# Patient Record
Sex: Male | Born: 1935 | Race: White | Hispanic: No | Marital: Married | Smoking: Former smoker
Health system: Southern US, Community
[De-identification: ages and names within clinical notes are randomized; demographics above are authoritative.]

## PROBLEM LIST (undated history)

## (undated) DIAGNOSIS — N189 Chronic kidney disease, unspecified: Secondary | ICD-10-CM

## (undated) DIAGNOSIS — I6350 Cerebral infarction due to unspecified occlusion or stenosis of unspecified cerebral artery: Secondary | ICD-10-CM

## (undated) DIAGNOSIS — J069 Acute upper respiratory infection, unspecified: Secondary | ICD-10-CM

## (undated) DIAGNOSIS — E781 Pure hyperglyceridemia: Secondary | ICD-10-CM

## (undated) DIAGNOSIS — I639 Cerebral infarction, unspecified: Secondary | ICD-10-CM

## (undated) DIAGNOSIS — H269 Unspecified cataract: Secondary | ICD-10-CM

## (undated) DIAGNOSIS — R51 Headache: Secondary | ICD-10-CM

## (undated) DIAGNOSIS — I10 Essential (primary) hypertension: Secondary | ICD-10-CM

## (undated) DIAGNOSIS — E7800 Pure hypercholesterolemia, unspecified: Secondary | ICD-10-CM

## (undated) DIAGNOSIS — K219 Gastro-esophageal reflux disease without esophagitis: Secondary | ICD-10-CM

## (undated) DIAGNOSIS — G4700 Insomnia, unspecified: Secondary | ICD-10-CM

## (undated) DIAGNOSIS — J309 Allergic rhinitis, unspecified: Secondary | ICD-10-CM

## (undated) DIAGNOSIS — N401 Enlarged prostate with lower urinary tract symptoms: Secondary | ICD-10-CM

## (undated) DIAGNOSIS — G459 Transient cerebral ischemic attack, unspecified: Secondary | ICD-10-CM

## (undated) HISTORY — DX: Cerebral infarction due to unspecified occlusion or stenosis of unspecified cerebral artery: I63.50

## (undated) HISTORY — DX: Pure hyperglyceridemia: E78.1

## (undated) HISTORY — PX: TONSILLECTOMY: SUR1361

## (undated) HISTORY — DX: Transient cerebral ischemic attack, unspecified: G45.9

## (undated) HISTORY — DX: Insomnia, unspecified: G47.00

## (undated) HISTORY — DX: Headache: R51

## (undated) HISTORY — DX: Unspecified cataract: H26.9

## (undated) HISTORY — DX: Benign prostatic hyperplasia with lower urinary tract symptoms: N40.1

## (undated) HISTORY — DX: Gastro-esophageal reflux disease without esophagitis: K21.9

## (undated) HISTORY — DX: Chronic kidney disease, unspecified: N18.9

## (undated) HISTORY — DX: Allergic rhinitis, unspecified: J30.9

## (undated) HISTORY — DX: Pure hypercholesterolemia, unspecified: E78.00

## (undated) HISTORY — DX: Essential (primary) hypertension: I10

## (undated) HISTORY — PX: KNEE SURGERY: SHX244

## (undated) HISTORY — PX: COLONOSCOPY: SHX174

---

## 1997-12-01 ENCOUNTER — Ambulatory Visit (HOSPITAL_BASED_OUTPATIENT_CLINIC_OR_DEPARTMENT_OTHER): Admission: RE | Admit: 1997-12-01 | Discharge: 1997-12-01 | Payer: Self-pay | Admitting: Orthopedic Surgery

## 1997-12-22 ENCOUNTER — Other Ambulatory Visit: Admission: RE | Admit: 1997-12-22 | Discharge: 1997-12-22 | Payer: Self-pay | Admitting: Orthopedic Surgery

## 1997-12-27 ENCOUNTER — Ambulatory Visit (HOSPITAL_COMMUNITY): Admission: RE | Admit: 1997-12-27 | Discharge: 1997-12-27 | Payer: Self-pay | Admitting: Orthopedic Surgery

## 1997-12-28 ENCOUNTER — Inpatient Hospital Stay (HOSPITAL_COMMUNITY): Admission: RE | Admit: 1997-12-28 | Discharge: 1998-01-02 | Payer: Self-pay | Admitting: Orthopedic Surgery

## 1998-01-04 ENCOUNTER — Encounter (HOSPITAL_COMMUNITY): Admission: RE | Admit: 1998-01-04 | Discharge: 1998-04-04 | Payer: Self-pay | Admitting: Orthopedic Surgery

## 1998-02-28 ENCOUNTER — Ambulatory Visit (HOSPITAL_BASED_OUTPATIENT_CLINIC_OR_DEPARTMENT_OTHER): Admission: RE | Admit: 1998-02-28 | Discharge: 1998-02-28 | Payer: Self-pay | Admitting: Orthopedic Surgery

## 1999-05-30 ENCOUNTER — Encounter: Payer: Self-pay | Admitting: Orthopedic Surgery

## 1999-05-30 ENCOUNTER — Inpatient Hospital Stay (HOSPITAL_COMMUNITY): Admission: RE | Admit: 1999-05-30 | Discharge: 1999-06-04 | Payer: Self-pay | Admitting: Orthopedic Surgery

## 2001-03-08 ENCOUNTER — Encounter: Admission: RE | Admit: 2001-03-08 | Discharge: 2001-03-08 | Payer: Self-pay | Admitting: Internal Medicine

## 2001-03-22 ENCOUNTER — Encounter: Admission: RE | Admit: 2001-03-22 | Discharge: 2001-03-22 | Payer: Self-pay | Admitting: Internal Medicine

## 2001-08-26 ENCOUNTER — Encounter: Admission: RE | Admit: 2001-08-26 | Discharge: 2001-08-26 | Payer: Self-pay | Admitting: Internal Medicine

## 2001-08-26 ENCOUNTER — Encounter: Payer: Self-pay | Admitting: Internal Medicine

## 2003-06-21 ENCOUNTER — Encounter: Admission: RE | Admit: 2003-06-21 | Discharge: 2003-06-21 | Payer: Self-pay | Admitting: Infectious Diseases

## 2004-05-08 ENCOUNTER — Ambulatory Visit: Payer: Self-pay | Admitting: Internal Medicine

## 2004-09-04 ENCOUNTER — Ambulatory Visit: Payer: Self-pay | Admitting: Internal Medicine

## 2004-09-16 ENCOUNTER — Ambulatory Visit: Payer: Self-pay | Admitting: Internal Medicine

## 2004-11-11 ENCOUNTER — Ambulatory Visit: Payer: Self-pay | Admitting: Internal Medicine

## 2004-11-19 ENCOUNTER — Ambulatory Visit: Payer: Self-pay | Admitting: Internal Medicine

## 2005-07-02 ENCOUNTER — Ambulatory Visit: Payer: Self-pay | Admitting: Internal Medicine

## 2006-06-23 HISTORY — PX: JOINT REPLACEMENT: SHX530

## 2006-07-02 ENCOUNTER — Ambulatory Visit: Payer: Self-pay | Admitting: Internal Medicine

## 2006-07-06 ENCOUNTER — Ambulatory Visit: Payer: Self-pay | Admitting: Internal Medicine

## 2006-07-06 LAB — CONVERTED CEMR LAB
ALT: 22 units/L (ref 0–40)
CO2: 26 meq/L (ref 19–32)
Chloride: 106 meq/L (ref 96–112)
Chol/HDL Ratio, serum: 4.8
Creatinine, Ser: 1.8 mg/dL — ABNORMAL HIGH (ref 0.4–1.5)
Eosinophil percent: 0.9 % (ref 0.0–5.0)
GFR calc Af Amer: 48 mL/min
GFR calc non Af Amer: 40 mL/min
Glomerular Filtration Rate, Af Am: 48 mL/min/{1.73_m2}
Glucose, Bld: 142 mg/dL — ABNORMAL HIGH (ref 70–99)
LDL DIRECT: 99.3 mg/dL
Lymphocytes Relative: 26.8 % (ref 12.0–46.0)
MCHC: 32.6 g/dL (ref 30.0–36.0)
MCV: 91 fL (ref 78.0–100.0)
Monocytes Absolute: 0.5 10*3/uL (ref 0.2–0.7)
Monocytes Relative: 7.6 % (ref 3.0–11.0)
Neutro Abs: 3.9 10*3/uL (ref 1.4–7.7)
Platelets: 248 10*3/uL (ref 150–400)
RBC: 4.78 M/uL (ref 4.22–5.81)
Sodium: 140 meq/L (ref 135–145)
TSH: 3.52 microintl units/mL (ref 0.35–5.50)
Triglyceride fasting, serum: 201 mg/dL (ref 0–149)
VLDL: 40 mg/dL (ref 0–40)

## 2006-07-14 ENCOUNTER — Ambulatory Visit: Payer: Self-pay | Admitting: Internal Medicine

## 2006-07-23 DIAGNOSIS — R809 Proteinuria, unspecified: Secondary | ICD-10-CM | POA: Insufficient documentation

## 2006-07-23 DIAGNOSIS — E7800 Pure hypercholesterolemia, unspecified: Secondary | ICD-10-CM | POA: Insufficient documentation

## 2006-07-23 DIAGNOSIS — M8618 Other acute osteomyelitis, other site: Secondary | ICD-10-CM

## 2006-08-18 ENCOUNTER — Ambulatory Visit: Payer: Self-pay | Admitting: Infectious Diseases

## 2006-08-18 LAB — CONVERTED CEMR LAB
HCT: 46.9 % (ref 39.0–52.0)
Hemoglobin: 14.7 g/dL (ref 13.0–17.0)
MCV: 93.2 fL (ref 78.0–100.0)
Platelets: 277 10*3/uL (ref 150–400)
RDW: 14.4 % — ABNORMAL HIGH (ref 11.5–14.0)
WBC: 6.2 10*3/uL (ref 4.0–10.5)

## 2006-09-21 ENCOUNTER — Inpatient Hospital Stay (HOSPITAL_COMMUNITY): Admission: RE | Admit: 2006-09-21 | Discharge: 2006-09-28 | Payer: Self-pay | Admitting: Orthopedic Surgery

## 2006-09-25 ENCOUNTER — Ambulatory Visit: Payer: Self-pay | Admitting: Infectious Diseases

## 2006-11-12 ENCOUNTER — Ambulatory Visit: Payer: Self-pay | Admitting: Internal Medicine

## 2006-11-18 ENCOUNTER — Ambulatory Visit: Payer: Self-pay | Admitting: Internal Medicine

## 2006-11-18 LAB — CONVERTED CEMR LAB
Basophils Relative: 0.4 % (ref 0.0–1.0)
CRP, High Sensitivity: 2 (ref 0.00–5.00)
Eosinophils Absolute: 0.1 10*3/uL (ref 0.0–0.6)
Eosinophils Relative: 1.9 % (ref 0.0–5.0)
Hemoglobin: 12.7 g/dL — ABNORMAL LOW (ref 13.0–17.0)
Lymphocytes Relative: 36.1 % (ref 12.0–46.0)
MCV: 85.8 fL (ref 78.0–100.0)
Monocytes Absolute: 0.4 10*3/uL (ref 0.2–0.7)
Neutro Abs: 2.2 10*3/uL (ref 1.4–7.7)
Platelets: 212 10*3/uL (ref 150–400)
WBC: 4.2 10*3/uL — ABNORMAL LOW (ref 4.5–10.5)

## 2006-11-29 LAB — CONVERTED CEMR LAB: Hgb A1c MFr Bld: 5.6 % (ref 4.6–6.0)

## 2006-11-30 ENCOUNTER — Inpatient Hospital Stay (HOSPITAL_COMMUNITY): Admission: RE | Admit: 2006-11-30 | Discharge: 2006-12-04 | Payer: Self-pay | Admitting: Orthopedic Surgery

## 2007-04-19 ENCOUNTER — Telehealth: Payer: Self-pay | Admitting: Internal Medicine

## 2008-04-04 ENCOUNTER — Encounter: Admission: RE | Admit: 2008-04-04 | Discharge: 2008-04-04 | Payer: Self-pay | Admitting: Orthopedic Surgery

## 2009-10-15 DIAGNOSIS — K439 Ventral hernia without obstruction or gangrene: Secondary | ICD-10-CM

## 2009-10-15 HISTORY — DX: Ventral hernia without obstruction or gangrene: K43.9

## 2010-02-21 DIAGNOSIS — G459 Transient cerebral ischemic attack, unspecified: Secondary | ICD-10-CM

## 2010-02-21 HISTORY — DX: Transient cerebral ischemic attack, unspecified: G45.9

## 2010-03-07 ENCOUNTER — Inpatient Hospital Stay (HOSPITAL_COMMUNITY): Admission: EM | Admit: 2010-03-07 | Discharge: 2010-03-08 | Payer: Self-pay | Admitting: Emergency Medicine

## 2010-03-07 ENCOUNTER — Ambulatory Visit: Payer: Self-pay | Admitting: Internal Medicine

## 2010-03-08 ENCOUNTER — Encounter (INDEPENDENT_AMBULATORY_CARE_PROVIDER_SITE_OTHER): Payer: Self-pay | Admitting: Internal Medicine

## 2010-03-13 ENCOUNTER — Encounter
Admission: RE | Admit: 2010-03-13 | Discharge: 2010-06-11 | Payer: Self-pay | Source: Home / Self Care | Attending: Family Medicine | Admitting: Family Medicine

## 2010-08-07 ENCOUNTER — Telehealth: Payer: Self-pay | Admitting: Internal Medicine

## 2010-08-14 NOTE — Progress Notes (Addendum)
Summary: needs to talk to nurse  Phone Note Call from Patient Call back at Home Phone [phone]   Caller: Myrtie Hawk = JEWEL = [phone] Summary of Call: Jewel called to talk to Dr Alwyn Ren about getting patient back in to see Dr Fredrik Cove seen 07/14/2006 for CPX---wife needs to talk about patients "situation"---she wouldnt explain what she was talking about----says that Dr Jimmey Ralph has been talking about patient's "situation" , but patient would like to get back in with Dr Alwyn Ren Initial call taken by: Jerolyn Shin,  August 07, 2010 5:06 PM  Follow-up for Phone Call        I called for more info and per the patient he transferred his care in 2008. (I do not see notes of this in the computer).  She put the patient on the phone and he states that his "situation" is "headaches and stuff" but it is being taken care of by another MD. Per the patient he does not need an appt with [facility]. Follow-up by: Lucious Groves CMA,  August 08, 2010 8:37 AM  Additional Follow-up for Phone Call Additional follow up Details #1::        Lauris Chroman; this patient transferred to another MD for same reason  as Bunnie Pion. The Schedulers should refer to Chrae or me if timely appt is an issue to the patient . I catch grief when I see these individuals outside the office.Thanks, Hopp Additional Follow-up by: Marga Melnick MD,  August 08, 2010 12:11 PM

## 2010-09-05 LAB — CBC
HCT: 45.3 % (ref 39.0–52.0)
Hemoglobin: 15.5 g/dL (ref 13.0–17.0)
MCH: 31.9 pg (ref 26.0–34.0)
MCHC: 34.2 g/dL (ref 30.0–36.0)
MCV: 93.2 fL (ref 78.0–100.0)
RDW: 13.7 % (ref 11.5–15.5)

## 2010-09-05 LAB — GLUCOSE, CAPILLARY
Glucose-Capillary: 128 mg/dL — ABNORMAL HIGH (ref 70–99)
Glucose-Capillary: 172 mg/dL — ABNORMAL HIGH (ref 70–99)

## 2010-09-05 LAB — COMPREHENSIVE METABOLIC PANEL
ALT: 28 U/L (ref 0–53)
ALT: 28 U/L (ref 0–53)
AST: 23 U/L (ref 0–37)
Alkaline Phosphatase: 47 U/L (ref 39–117)
Alkaline Phosphatase: 48 U/L (ref 39–117)
BUN: 21 mg/dL (ref 6–23)
CO2: 25 mEq/L (ref 19–32)
Calcium: 9 mg/dL (ref 8.4–10.5)
Chloride: 109 mEq/L (ref 96–112)
GFR calc Af Amer: 60 mL/min — ABNORMAL LOW (ref >60)
GFR calc non Af Amer: 46 mL/min — ABNORMAL LOW (ref >60)
Glucose, Bld: 120 mg/dL — ABNORMAL HIGH (ref 70–99)
Potassium: 4.1 mEq/L (ref 3.5–5.1)
Potassium: 4.3 mEq/L (ref 3.5–5.1)
Sodium: 139 mEq/L (ref 135–145)
Sodium: 139 mEq/L (ref 135–145)
Total Bilirubin: 0.6 mg/dL (ref 0.3–1.2)
Total Protein: 6.3 g/dL (ref 6.0–8.3)
Total Protein: 6.8 g/dL (ref 6.0–8.3)

## 2010-09-05 LAB — DIFFERENTIAL
Basophils Absolute: 0 10*3/uL (ref 0.0–0.1)
Basophils Relative: 0 % (ref 0–1)
Eosinophils Absolute: 0.1 10*3/uL (ref 0.0–0.7)
Monocytes Relative: 7 % (ref 3–12)
Neutro Abs: 2.8 10*3/uL (ref 1.7–7.7)
Neutrophils Relative %: 54 % (ref 43–77)

## 2010-09-05 LAB — URINALYSIS, ROUTINE W REFLEX MICROSCOPIC
Bilirubin Urine: NEGATIVE
Glucose, UA: NEGATIVE mg/dL
Hgb urine dipstick: NEGATIVE
Protein, ur: NEGATIVE mg/dL

## 2010-09-05 LAB — LIPID PANEL
Cholesterol: 166 mg/dL (ref 0–200)
LDL Cholesterol: 51 mg/dL (ref 0–99)
Triglycerides: 383 mg/dL — ABNORMAL HIGH (ref <150)

## 2010-09-05 LAB — CK TOTAL AND CKMB (NOT AT ARMC)
CK, MB: 2.4 ng/mL (ref 0.3–4.0)
CK, MB: 2.6 ng/mL (ref 0.3–4.0)
Total CK: 167 U/L (ref 7–232)

## 2010-09-05 LAB — PROTIME-INR: INR: 0.93 (ref 0.00–1.49)

## 2010-09-05 LAB — APTT: aPTT: 28 seconds (ref 24–37)

## 2010-11-05 NOTE — Op Note (Signed)
NAME:  Jason, [name] NO.:  [id number]   MEDICAL RECORD NO.:  [id number]          PATIENT TYPE:  INP   LOCATION:  5025                         FACILITY:  [facility]   PHYSICIAN:  Feliberto Gottron. Turner Daniels, M.D.   DATE OF BIRTH:  [birth year]   DATE OF PROCEDURE:  11/30/2006  DATE OF DISCHARGE:                               OPERATIVE REPORT   PREOPERATIVE DIAGNOSIS:  Status post removal of infected right total  knee prosthesis a little over 8 weeks ago with methicillin-sensitive  Staphylococcus aureus, placement of PMMA spacer.   POSTOPERATIVE DIAGNOSIS:  Status post removal of infected right total  knee prosthesis a little over 8 weeks ago with methicillin-sensitive  Staphylococcus aureus, placement of PMMA spacer.   PROCEDURES:  1. Removal of PMMA spacer.  2. Revision right total knee using DePuy SROM components.  On the      femoral side a large posterior-stabilized femur with a 40 mm module      and a 17 x 100 stem.  On the tibial side a medium tibial baseplate      with a 45 module and a 13 x 100 stem.  A 12 mm posterior-stabilized      rotating bearing and a 41 mm patellar button.  All components      cemented with Palacos cement, a triple batch with 2.2 g of Zinacef.   SURGEON:  Feliberto Gottron. Turner Daniels, M.D.   FIRST ASSISTANT:  Erskine Squibb B. Jannet Mantis.   ANESTHETIC:  General endotracheal with a femoral nerve block.   ESTIMATED BLOOD LOSS:  200 mL because part of the procedure was done  with the tourniquet down .   FLUID REPLACEMENT:  1500 mL crystalloid.   TOURNIQUET TIME:  2 hours 5 minutes.   INDICATIONS FOR PROCEDURE:  The patient is a 75 year old gentleman who  had a primary total knee done in 1999.  Had an infection with Staph  aureus about a year later that responded well to an open washout and  spacer swap.  He was suppressed on antibiotics for almost 9 years,  played tennis, golf was very active, and just this year had the  development of an abscess on the lateral  side of the right knee.  The  abscess went down deep to the knee when we took him to surgery for I&D,  and the components were therefore removed and a PMMA spacer placed.  It  was still methicillin-sensitive staph.  He was treated with 6 weeks of  IV antibiotics, which were discontinued.  Two weeks later we attempted  aspirate the spacer block area.  We got 1 mL of fluid back that had no  white cells and no organisms was culture-negative.  His sedimentation  rate and CRP went to normal.  He elected to have a removal of the spacer  and reimplantation total knee.  He did have some significant bony lysis  around the tibia and the femur, and stemmed implants from the SROM  system were selected.  Risks and benefits of surgery discussed.  He  understands that the chance of am infection recurring  is around 15%.   DESCRIPTION OF PROCEDURE:  After placement of a femoral nerve block in  the block area, the patient was taken to the operating room at [facility] and the appropriate anesthetic monitors were attached, general  endotracheal anesthesia induced with the patient in supine position.  Tourniquet applied high to the right thigh, lateral post and foot  positioner applied, and the right lower extremity prepped and draped in  the usual sterile fashion from the toes to the tourniquet.  We began the  procedure with the tourniquet down.  Utilizing the old anterior midline  incision, we cut through the skin and subcutaneous tissue down to the  prepatellar fascia and quad tendon and patellar tendon and then made a  medial parapatellar arthrotomy, allowing [place] to enter the pseudo-joint of  the knee where the femorotibial spacer and the patellofemoral spacer  were located.  Exuberant scar tissue was removed, gram stain and  cultures were sent that came back negative, and after freeing up the  patella we removed the patellar spacer without any difficulty.  The  tibiofemoral spacer was removed  piecemeal with osteotomes.  We did  elevate the superficial medial collateral ligament from anterior to  posterior around the tibia, leaving it intact distally to enhance our  exposure.  Further scar tissue was removed.  The knee was then flexed  with the patella not everted at the beginning as we removed scar tissue  and then as things loosened up, we were able to evert the patella.  We  then instrumented the proximal tibia with the reamers and reamed up to a  13 mm reamer to the depth for a 100 stem.  The a conical ream followed  by the broaching up to a 45 tibial broach, which allowed a 1-2 mm  resection off the broach of the proximal tibia.  We then entered the  distal femur with the reamers, reamed up to a 17 reamer to the depth a  for 100 stem, conically reamed the femur and broached up to a 40 femoral  broach.  A thin resection was done off the distal femur with that broach  and because of the intact ligaments, we elected to do a posterior-  stabilized Noyes knee at this point.  We selected the large femoral  component for sizing and then utilizing the AP cutting block pinned the  cutting block in place and there was no AP cut to actually perform.  This was followed by the chamfer cutting block, which did remove a  little bit of bone but not much.  We then removed the broach and the  trial stem and assembled a trial femoral component with a large right  trial femur, a 40 conical module, and a 17 x 100 stem.  On the tibial  side we used a 3 tibial baseplate and used a 12 mm posterior-stabilized  trial spacer.  The knee came to full extension, flexed to about 110  degrees, limited by scar tissue.  The patella, which had been sized to a  41 button, tracked in a good fashion.  At this point the knee was placed  in extension.  The patella was everted.  One to 2 mm of bone was  resected from the patella and scar tissue was removed.  We sized for a 41 button and drilled the lollipop.  At  this point all trial components  were removed, being careful to note the rotation of the cones on  the  femur and the tibia.  All bony surfaces were then water-picked clean and  dried with suction and sponges.  At this point a triple batch of Palacos  cement with 2.25 g of Zinacef was mixed, applied to all bony and  metallic surface after first assembling the real components on the  tibial and femoral side, being careful to match the rotation of the  modules, and then in order on the tibial side we hammered into place a  medium tibial baseplate with a 45 cone and a 13 x 100 stem, on the  femoral side a large femoral component with a 40 module and a 17 x 100  stem.  The patellar button selected was a 41 patellar button, and this  was squeezed into place.  Excess cement was removed.  The knee was taken  to full extension to further compress the cement and then taken through  a range of motion, confirming good patellar tracking.  Medium Hemovac  drains were placed deep in the wound and once again the surfaces were  water-picked clean.  The parapatellar arthrotomy was closed with running  #1 Vicryl suture, the subcutaneous tissue, because he is relatively  slender, with 2-0 Vicryl suture, and the skin with skin staples.  A  dressing of Xeroform, 4x4 dressing sponges, Webril and an Ace wrap  applied.  At this point the tourniquet was let down.  The patient was  awakened and taken to the recovery room without difficulty.      Feliberto Gottron. Turner Daniels, M.D.  Electronically Signed     FJR/MEDQ  D:  11/30/2006  T:  12/01/2006  Job:  [id number]

## 2010-11-05 NOTE — Discharge Summary (Signed)
NAME:  Jason [name], Jason [name]              ACCOUNT NO.:  [id number]   MEDICAL RECORD NO.:  [id number]          PATIENT TYPE:  INP   LOCATION:  5015                         FACILITY:  [facility]   PHYSICIAN:  Erskine Squibb B. Su Hilt, P.A.  DATE OF BIRTH:  [birth year]   DATE OF ADMISSION:  09/21/2006  DATE OF DISCHARGE:  09/28/2006                               DISCHARGE SUMMARY   PRIMARY DIAGNOSIS FOR THIS ADMISSION:  Chronically infected right total  knee arthroplasty.   PROCEDURE WHILE IN HOSPITAL:  Removal of right total knee arthroplasty  with irrigation and debridement.   DISCHARGE SUMMARY:  The patient is a 75 year old male who underwent  total knee arthroplasty in 1999.  He developed an infection about 14  months after the initial implant that was Methylin sensitive staph.  The  patient was able to be treated with irrigation debridement, poly change-  out, and suppressive therapy of Keflex and rifampin for several years.  For the last 2 years he has been on Keflex only.  He did well until a  few months ago when after changing his work out routine, he started  developing more swelling which led to a lateral abscess in the knee.  A  CT scan was accomplished showing multiple bony cysts consistent with  bony abscesses.  No listing of the implants, but because of the soft  tissue abscess which came back culture negative times 3, probably  because of the antibiotic suppression, it was felt that he should be  taken for a radical irrigation debridement with removal of the part.  Risks versus benefits were discussed and he has agreed to the surgery.   ALLERGIES:  NO KNOWN DRUG ALLERGIES.   MEDICATIONS:  Rifampin, Keflex, and Vytorin.   PAST MEDICAL HISTORY:  Usual childhood diseases.  Adult history:  DJD  and increased cholesterol.   PAST SURGICAL HISTORY:  Right total knee arthroplasty right knee.  He  has had some difficulty with anesthesia.   SOCIAL HISTORY:  No tobacco.  Occasional ethanol.  No  IV drug abuse.  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   FAMILY HISTORY:  Mother died at age 32.  Father died at age 50 with a  history of MI.   REVIEW OF SYSTEMS:  Positive for glasses.  Denies any chest pain,  shortness of breath, or illness recently other than the infection in the  knee.   PHYSICAL EXAMINATION:  VITAL SIGNS:  The patient's temperature is 98.6,  pulse 80, respirations 18, blood pressure 150/84.  GENERAL:  He is a 6 foot, 200 pound male.  HEAD:  Normocephalic, atraumatic.  EARS:  TMs are clear.  EYES:  Pupils equal, round, and reactive to light and accommodation.  THROAT:  Patent.  NECK:  Supple.  Full range of motion.  CHEST:  Clear to auscultation and percussion.  HEART:  Regular rate and rhythm.  ABDOMEN:  Soft, nontender.  EXTREMITIES:  The right knee has positive edema with a draining wound  laterally.  Minimal pain.  Stable ligaments.  SKIN:  Within normal limits with exception of the open  abscess on the  right knee.   CT scan with positive bony erosion secondary to infection.   PREOPERATIVE LABS:  Including CBC, c-Met, chest x-ray, EKG, and PTT were  all within normal limits with exception of the EKG showing sinus  bradycardia.   HOSPITAL COURSE:  On the day of admission, the patient was taken to the  operating room at [facility] where he underwent a radical irrigation  and debridement of the right total knee with removal of all metallic  parts, cement, and plastic followed by placement of a  polymethylmethacrylate spacer, which was a quadruple batch of poly  __________ bone cement with a 4.8 grams of tobramycin ad 3 grams of  Zinacef imbedded into the cement.  Tissue specimens were sent for  culture.  The patient was placed on the pre and perioperative  antibiotics.  He was placed on postoperative Coumadin prophylaxis with a  target INR of 1.5 to 2, with Lovenox bridging until he became  therapeutic.  He was put on a PCA Dilaudid pain pump for  pain control.  Large bore HemoVac's were placed inside the wound.  There was really no  significant physical therapy goal in the short-term.  Postoperative day  1, the patient was awake and alert with moderate pain.  Vital signs were  stable.  Dressing was soaked through, one was clean and dry at the time  of exam.  Hemoglobin 11.6, WBC 5.7.  PT 15.  Culture was negative times  24-hours.  IV antibiotics were continued with a plan for vancomycin for  6 to 12 weeks postoperatively.  Dr. Orvan Falconer from infectious disease was  consulted for further evaluation.  Postoperative day 2, the patient was  complaining of moderate pain.  T-max 99.3.  WBC of 5.5.  INR 1.3.  High  dressing with scant blood.  Drain output was 65 mL, otherwise stable.  Foley was discontinued without difficulty.  Postoperative day 3,  basically unchanged other than WBC dropping to 4.9, INR to 1.5, drain  output 25+ Q-shift.  PCA was discontinued.  PIC line placement was  ordered, the plan being to continue IV antibiotics for 6 weeks  postoperatively, as well as rifampin.  Postoperative day 4, the patient  was without complaint.  He was out of bed to the hallway with a knee  mobilizer, afebrile, vital signs were stable.  Drain output 75 mL.  The  wound was benign.  Minimal oozing at the incision.  Otherwise stable.  Postoperative day 5, basically unchanged.  Continue IV antibiotics.  Drain remained active.  Postoperative day 6, again no change.  Postoperative day 7, the patient was without complaint.  He was out of  bed to the hall.  Independent transfers.  He was afebrile.  Vital signs  were stable.  PT of 19.5, INR 1.6, drain output was zero for 24-hours  and was discontinued.  Wound was benign.  He was discharged home in the  care of his family.  He will be followed by [facility] regarding  specifics of home IV therapy.  IV Ancef 1 gram every eight hours times six weeks.  Continue with Coumadin per pharmacy protocol.   As well as  Tylox for pain.  Resume his Vytorin.  Two days partial weight-bearing of  the right lower extremity with a walker.  He may shower on postoperative  day #10.  Dressing changes every day.  Return to see [place] sooner should he  have any difficulties.  Laural Benes. Jannet Mantis.     JBR/MEDQ  D:  11/09/2006  T:  11/09/2006  Job:  [id number]

## 2010-11-08 NOTE — Discharge Summary (Signed)
NAME:  Jason [name], Jason [name] NO.:  [id number]   MEDICAL RECORD NO.:  [id number]          PATIENT TYPE:  INP   LOCATION:  5025                         FACILITY:  [facility]   PHYSICIAN:  Feliberto Gottron. Turner Daniels, M.D.   DATE OF BIRTH:  [birth year]   DATE OF ADMISSION:  11/30/2006  DATE OF DISCHARGE:  12/04/2006                               DISCHARGE SUMMARY   ADMISSION DIAGNOSIS:  History of right knee infection with removal of  total knee arthroplasty parts.   PROCEDURE:  Reimplantation of total knee arthroplasty of the right knee  status post infection.   DISCHARGE SUMMARY:  The patient is a 75 year old gentleman who had prior  total knee done in 1999, had an infection with Staphylococcus aureus  about a year later that responded well to open washout and spacer swap.  He was suppressed on antibiotics for almost 9 years, played tennis,  golf, otherwise very active.  Just this last year has had development of  an abscess on the lateral side of the right knee.  Abscess went down to  the knee and he was taken to surgery for an I&D, components removed and  a PMMA spacer placed.  It was still methicillin-sensitive  Staphylococcus.  He was treated with 6 weeks of IV antibiotics, which  were discontinued.  Two weeks later re-aspirate was attempted, 1 mL  fluid had no white cells, no organisms, otherwise culture negative.  Sed  rate and CRP were normal.  He was elected to have removal of the spacer  reimplantation of total knee.  He has had significant bony lysis around  the tibia and the femur and stem implants from the SROM system will be  used.  Risk and benefits of the surgery were discussed.  He understands  the chance of a reinfection is somewhere around 15%.   ALLERGIES:  THE PATIENT HAS NO KNOWN DRUG ALLERGIES.   MEDICATIONS:  Occasional Percocet.   PAST MEDICAL HISTORY:  1. Usual childhood disease.  2. Adult history degenerative joint disease.  3. Increased cholesterol.   PAST SURGICAL HISTORY:  Right total knee arthroplasty,  removal of right  total knee arthroplasty, no difficulty or DVT.   SOCIAL HISTORY:  No tobacco.  Positive ethanol occasionally.  No IV drug  abuse.  He is married.  He is a Psychologist, sport and exercise here in town.   FAMILY HISTORY:  Mother died at age 41.  Father died at age 16 with  history of heart attack.   REVIEW OF SYSTEMS:  Positive for glasses only.   PHYSICAL EXAMINATION:  VITAL SIGNS:  The patient is afebrile,  respirations 28, blood pressure 148/90, he is 6 feet and 200 pounds.  HEAD:  Normocephalic, atraumatic.  EARS:  Tympanic membranes clear.  EYES:  Pupils equal, round, reactive to light and accommodation.  NOSE:  Pink.  THROAT:  Benign.  NECK:  Full range of motion.  CHEST:  Clear to auscultation and percussion.  HEART:  Regular rate and rhythm.  ABDOMEN:  Soft and nontender.  Bowel sounds 2+.  GENITOURINARY:  Not performed.  EXTREMITIES:  Right knee  minimal tenderness, minimal erythema, no  obvious infection.  SKIN:  Neurovascularly intact with a well-healed normal scar.   LABORATORY DATA:  Preoperative labs including CBC, c-Met, chest x-ray,  EKG, PT, and PTT were all within normal limits with the exception of BUN  of 25 and an eGFR of 46.   HOSPITAL COURSE:  On day of admission, the patient was taken to the  operating room at [facility] where he underwent removal of PMMA  spacer followed by the revision of right total knee arthroplasty and  Depuy SROM components.  Femoral side used a large posterior stabilized  femur with a 40 mm module and a 17 x 100 stem on the tibial side, medium  tibial baseplate was 45 module and a 13 x 100 stem, a 12 mm posterior  stabilize rotating bearing, and a 41 mm patellar button.  All components  cemented with Palacos cement, triple batch with 2.2 grams of Zinacef  embedded.  Medium Hemovac drain was placed postoperatively, double-armed  into the wound.  The patient was placed on  preoperative antibiotics,  placed on postoperative Coumadin prophylaxis.  We will target an INR of  1.5 to 2 with protein and Lovenox therapies.  Placed on a PCA Dilaudid  pump for pain control.  On postoperative day 1, the patient was awake  and alert, in moderate pain, taking p.o. as well.  No nausea or  vomiting.  Hemoglobin 10.0, WBC 4.4, PT of 14.5.  Urine output stable.  X-rays showed well-placed prosthesis, otherwise stable.  Hemovac was  discontinued.  The patient began working on range of motion.  On  postoperative day 2, the patient continued to respond well to physical  therapy.  Vital signs were stable.  INR of 1.7, hemoglobin of 9.1,  cultures remained negative from the intraoperative cultures that were  done.  Postoperative day 3, the patient was without complaint.  Progressing well with physical therapy.  Temperature max of 100.4  ranging 99.2.  WBC of 3.9, hemoglobin at 8.2, INR 1.7.  The wound was  benign.  Physical therapy was continued.  Postoperative day 4, the  patient was without complaint.  Out of bed to the hallway with  independent transfers.  He was afebrile.  INR 1.6.  The dressing was dry  and was benign.  He was otherwise medically stable and orthopedically  improved.  He was discharged home in the care of his family.  Return to  the clinic in approximately one week's time.  Dressing changes daily or  as needed.  He was okayed for seated showers.   DISCHARGE MEDICATIONS:  1. Tylox.  2. Coumadin with followup care by [facility] per their      pharmacy protocol.  3. Robaxin.  Home medication requisition sheet was signed.   ACTIVITY:  Weightbearing as tolerated with total knee precautions using  a walker.  Home health PT, home health RN.   DIET:  Regular.      Laural Benes. Jannet Mantis.      Feliberto Gottron. Turner Daniels, M.D.  Electronically Signed    JBR/MEDQ  D:  03/29/2007  T:  03/29/2007  Job:  [id number]

## 2010-11-08 NOTE — Discharge Summary (Signed)
[place]  Patient:    Jason [name], Jason [name]                       MRN: [id number] Adm. Date:  05/30/99 Disc. Date: 06/04/99 Attending:  Alinda Deem, M.D. Dictator:   Dorthula Matas, P.A.-C.                           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  PROCEDURE WHILE IN HOSPITAL:  Revision of right total knee with radical irrigation debridement and total synovectomy and debridement of osteomyelitis.  INTRODUCTION:  The patient is a 75 year old male admitted for orthopedic treatment of infected right total knee arthroplasty.  HISTORY OF PRESENT ILLNESS:  The patient had a positive history of right knee pain times many years post football injury.  He underwent a right knee open meniscectomy in his early 35s followed by steroid injection and arthroscopy.  He was seen by  Dr. Turner Daniels with initial office visit May 1999 with symptoms of increasing pain with catching.  He had two Hyalgan injection.  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  He postoperatively had a 3 to 4+ effusion with temperature of 101, negative aspirate.  On June 22, cryocuff placed.  The effusion slowly resolved and pain abated.  Reaspiration on December 22, 1997, negative for any organism, no growth in cultures.  The patient proceeded with a right total knee arthroplasty on December 28, 1997.  His postoperative course positive difficulty with scarring and lack of motion or progress despite PT and CPM.  He had closed manipulation under anesthesia February 28, 1998, with good progress in range of motion postoperatively.  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  He ad had no fevers or chills, positive aspirate of 10 cc of serosanguineous fluid in  ______  bursa along with a 10 cc similar fluid aspirate of the anterior port of  the knee joint.  No obvious pus.  The patient was afebrile.  Gram stain was negative.  Cultures on day #3 grew out positive Staphylococcus which was resistant to penicillin and sulfa.  The patient was placed on Keflex on May 22, 1999. He continued until his surgery day on December 7.  He had seen some improvement in pain and swelling and was afebrile during this course.  X-rays showed well-placed cemented components with no obvious signs of loosening.  SOCIAL HISTORY:  No tobacco, occasional ethanol.  No IV drug abuse.  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  PAST MEDICAL HISTORY:  Usual childhood diseases.  FAMILY HISTORY:  Mother deceased at age 41 due to complications of old age. Father deceased at age 13 after heart attack.  Adult disease: DJD.  PAST SURGICAL HISTORY:  Knee arthroscopy in 1963, arthroscopy in 1983, 1999. Total knee arthroplasty in 1999.  No difficulties with ______ .  ALLERGIES:  No known drug allergies.  MEDICATIONS ON ADMISSION: 1. Vioxx 25 mg 1 p.o. q.d. 2. Keflex 500 mg 1 p.o. 4 times  a day.  REVIEW OF SYSTEMS:  Positive only for glasses.  He denies all others.  PHYSICAL EXAMINATION:  VITAL SIGNS:  The patient is 72 inches tall and weighs 200 pounds.  Blood pressure 114/76.  HEENT:  Head is normocephalic, atraumatic.  Eyes: PERRLA.  TMs are clear.  Nose  clear without polyps.  Throat is clear.  NECK: Supple.  Full range of motion.  RESPIRATORY:  Clear to auscultation and percussion.  CARDIOVASCULAR:  Regular rate and rhythm with no murmurs, rubs,  or gallops.  ABDOMEN:  Soft, nontender.  No masses.  Bowel sounds 2+.  GU/RECTAL:  Deferred.  NEUROLOGIC:  Alert and oriented x 3.  Normal speech pattern.  Cranial nerves II-XII grossly intact.  ORTHOPEDIC:  Range of motion of the knee shows 1 to 2+ effusion with swollen bursa, pain and joint tenderness, neurovascularly intact to pinprick and light touch.  DTRs 2+, pulses 2+.  LABORATORY DATA:  Preoperative labs including CBC, chest x-ray, EKG, PT, and PTT were all within normal limits with the exception of sed rate of 39, potassium 5.1, C reactive protein 1.9.  HOSPITAL COURSE:  On the day of admission, the patient was taken to the operating room at [facility] where he underwent radical irrigation and debridement of the right total knee with complete total synovectomy, debridement of osteomyelitis both of the femur and the tibia, and revision of patellar and tibial components with positive application of antibiotic-laden cement.  Large bore Hemovacs were placed in the knee.  Foley catheter was placed as well.  The patient was placed on postoperative Coumadin with target INR of 1.5 to 2 per pharmacy protocol.  He was placed on postoperatively IV antibiotics, Ancef 2 g IV q.8h. s well as rifampin 300 mg b.i.d.  He was placed on a morphine PCA pump for postoperative pain control and seen in consultation with infectious disease medicine.  On the first postoperative day, the patient was awake and alert.  Decreased pain. Drain had clotted blood positive but no obvious pus.  Vital signs were stable. The patient was neurovascularly intact.  He was otherwise stable.  He was kept in an immobilizer to prevent any type of bending or other stress to the knee.  He began transferring out of bed, weightbearing as tolerated, with immobilizer.  On the second postoperative day, the patient was doing well.  Drains had positive serous drainage, no obvious pus.  He was  neurovascularly intact.  He continued V antibiotics.  On the third postop day, no significant changes.  He was doing well, taking oral foods and fluids, continued on the combination of Ancef and rifampin as well as   Coumadin.  On the fourth postoperative day, the patient was awake and alert, decreased pain. Vital signs were stable.  He was afebrile.  Drain output 25 cc and discontinued. PT 18.6.  Hemoglobin 11.6, WBC 5.  The wound was clean and dry with no effusion, and he was otherwise stable.  He continued with Ancef and rifampin and continued limited walking, weightbearing as tolerated.  On the fifth postoperative day, the patient was awake and alert, decreased pain. He was walking the room with a walker.  Vital signs were stable.  Wounds were clean and dry, decreased swelling, neurovascularly intact and was otherwise orthopedically stable.  He was discharged home to the care of family on oral Keflex as well as rifampin.  He had actually been changed over to Keflex the day before and had no  increase in temperature.  FOLLOWUP: He will return to see Dr. Turner Daniels in one week time, sooner should he have any increase in temperature, increased drainage, increase in pain.  ACTIVITY:  Weightbearing as tolerated with walker and immobilizer.  No movement of knee up to this point.  DISCHARGE MEDICATIONS:  Tylox 1 to 2 q.4h. p.r.n. pain as well as the above-mentioned antibiotics.  FINAL DIAGNOSIS:  Left total knee arthroplasty with positive Staphylococcus aureus infection.  DIET:  Regular.  WOUND CARE:  Dressing changes q.d. with 4x4s and Ace wrap. DD:  07/03/99 TD:  07/03/99 Job: [id number]

## 2010-11-08 NOTE — Op Note (Signed)
[place]  Patient:    Jason [name]                      MRN: [id number] Proc. Date: 05/30/99 Adm. Date:  [id number] Attending:  Alinda Deem                           Operative Report  PREOPERATIVE DIAGNOSIS:  Infected right total knee arthroplasty with Staphylococcus epidermitis, methicillin-sensitive.  POSTOPERATIVE DIAGNOSIS:  Infected right total knee arthroplasty with Staphylococcus epidermitis, methicillin-sensitive.  OPERATION:  Radical irrigation and debridement of right total knee with complete total synovectomy, debridement of osteomyelitis both of the femur and the tibia. Revision of patella and tibia components.  SURGEON:  Alinda Deem, M.D.  ASSISTANTS: 1. Dorthula Matas, P.A.C. 2. Dannette Barbara, P.A.S.  ANESTHESIA:  General LMA.  ESTIMATED BLOOD LOSS:  300 cc at the end of the case after the tourniquet was let down.  TOURNIQUET TIME:  1 hour and 57 minutes.  FLUIDS REPLACED:  1200 cc of crystalloid.  DRAINS:  Large bore Hemovacs.  CULTURES:  Tissue culture sent during surgery.  INDICATION:  A 75 year old man had a cemented Osteonics right total knee arthroplasty in March of 1999.  Postoperatively, he developed arthrofibrosis and did require closed manipulation and did relatively well for the next 16 to 17 months.  He was last seen in the office in March of the year 2000 doing relatively well with his right total knee.  About one week ago, he presented in the office with a heme arthrosis as well as a collection of fluid near the pes bursa insertion.  Both of these were tapped yielding bloody fluid which was sent off for Gram-stain and culture and unfortunately both of the samples came back positive for Staphylococcus epidermitis coagulase negative methicillin-sensitive.  His knee at that time was noted to be 2 to 3+ swollen, edematous, and he was placed on Keflex prior to the results of  the Gram-stain and cultures.  It should be noted that the initial Gram-stain came back occasional polyorganism and the cell count on both of the fluids were under 10,000 white cells.  This was surely a smoldering  infection and he had never any fever measured by mouth.  On further questioning, he did report that for the last couple of months he has had some increased pain and warmth in the knee but again was able to function with t. Planned radiographs did show some osteolysis around the proximal tibia but since he had been symptomatic for only a week or two as far as the swelling and pain went, we went ahead and planned to do a radical synovectomy, replacement of the polyethylene both on the patella and tibia, and of course if the tibia or femur  components were loose they would be removed and a resection arthroplasty would e accomplished with insertion of a PMA spacer.  The risks and benefits of surgery were understood by the patient and the synovectomy would be followed by six months of IV and p.o. antibiotics based on the sensitivity of the organism, probably Ancef and rifampin initially followed by Keflex and Rifampin.  DESCRIPTION OF PROCEDURE:  The patient was identified by arm band and taken to he operating room at [facility] main hospital.  Appropriate anesthetic monitors were attached and general LMA anesthesia induced with the patient in the supine position.  Tourniquet  was applied high to the right thigh and the right lower extremity was then elevated for about three minutes as it was prepped with DuraPrep.  At this point, the tourniquet was let up and we completed the prep and drape. he tourniquet was completed to 350 mmHg.  Using the old anterior midline incision we cut through the skin and subcutaneous tissue and did not encounter any purulent fluid until we cut into the joint using a medial parapatellar approach.  Unfortunately, there  was exuberant inflammation f the synovium requiring a radical synovectomy.  There were erosions around the femoral component about 1/8th of an inch in depth, although the femoral component was not loose and it was grasped with the extractor and found to be rock solid tight.  Laterally, there was some posterior erosions as well and these were all carefully curetted out back to bare, bleeding bone.  The patellar component was noted to be mildly loose and was removed without difficulty.  Loose bits of cement were also removed and the patella was recut removing about 1 mm of bone and then redrilled to accept an 11 mm modified medial patellar button.  The tibial spacer was removed without difficulty.  We noted the significant erosions to the anteromedial aspect of the proximal tibia approximately 15 mm in length, 10 mm deep, and 10 mm high.  This was also carefully curetted ut being careful to leave behind stable bone and there was noted to be remodeling f the bone at the periphery.  The tibial component was also noted to be solid.  When it was grasped with the extractor, there was no evidence of loosening.  We continued to perform a global synovectomy of all compartments of the knee and got back to healthy-looking tissue.  Once we had accomplished this, a single batch of Palicose cement was mixed with  3000 mg of Zinacef and the bony deficits underneath the tibia were packed with cement and a new patella component was cemented into place.  Excess cement was removed.  Cement was also packed around the small defects on the femur to provide both support and antibiotic delivery vehicle.  Large bore Hemovacs were then placed in the wound and the parapatellar arthrotomy was closed with running #1 Vicryl suture.  The subcutaneous tissue was closed with a single layer of 2-0 nylon vertical mattress sutures.  Continuously throughout  this procedure, we used both a combination of  normal saline pulse lavage, a one  point Clorpactin was left in the wound for about 90 seconds.  A strip of biofilm  from the components and we also used antibiotic solution.  This was all done prior to the closure.  Once the closure had been completed, a dressing of 4 by 4s, ABD, Webril, and Ace wrap was applied.  The tourniquet was left down and the patient was awakened and taken to the recovery room under suction drainage without difficulty. DD:  05/30/99 TD:  06/01/99 Job: [id number]

## 2010-11-08 NOTE — Letter (Signed)
August 05, 2006    [name]  [address]  [place], [place] [address]   RE:  RUGER, SAXER  MRN:  [id number]  /  DOB:  02-17-1936   Dear Art,   I received a fax from [facility] delineating the policy was not  issued.They stated that there had been a change in the EKG.  I compared  that EKG of July 07, 2006 to the one performed in my office, July 16, 2006.  There was a slight change in the electrical axis on their  EKG, but I feel this may have been a lead placement issue.  This was not  found on our EKG and there was no sign of ischemia or poor blood flow.  I do not feel that there is any evidence of any active coronary artery  disease.   The major issue is their concern about your kidney function and this is  a valid concern.  In reviewing your past records, your creatinine, a  measure of kidney function, was 1.8 on July 06, 2006.  Normals are  now less than 1.5.  The value was 1.6 in January 2007, but at that time,  normal creatinine was felt to be 1.6 or less.  The creatinine was also  1.6 in May 2006.  This elevation may be related to some of the  medications you have taken for the methicillin-resistant Staph aureus,  but I am more concerned about it being related to early diabetes.  Enclosed is a letter  I have written diabetes & its prevention &  control. This reiterates nutritional interventions and also explains the  A1c.  Your A1c was 6.1, which is at the diabetic level, associated with  an average sugar of 139 and a 22% increased risk of stroke.   I would recommend drinking 40 ounces of water a day and following these  nutritional guidelines.  I would recommend repeating the A1c, urine  microalbumin, BUN and creatinine in May.   The significance of the creatinine elevation is reinforced by a seconnd  kidney function test the glomerular filtration rate. Your value was  40;  this should be at least 60.This should be rechecked in  May as well.   If your creatinine continues to rise despite these nutritional  interventions a Nephrology (kidney) consultation would be appropriate.    Sincerely,      Titus Dubin. Alwyn Ren, MD,FACP,FCCP  Electronically Signed    [facility]/MedQ  DD: 08/05/2006  DT: 08/05/2006  Job #: [id number]

## 2010-11-08 NOTE — Op Note (Signed)
NAMEZALMAN, Jason [name] NO.:  [id number]   MEDICAL RECORD NO.:  [id number]          PATIENT TYPE:  INP   LOCATION:  2899                         FACILITY:  [facility]   PHYSICIAN:  Feliberto Gottron. Turner Daniels, M.D.   DATE OF BIRTH:  [birth year]   DATE OF PROCEDURE:  09/21/2006  DATE OF DISCHARGE:                               OPERATIVE REPORT   PREOPERATIVE DIAGNOSIS:  Chronic infected right total knee.   POSTOPERATIVE DIAGNOSIS:  Chronic infected right total knee.   OPERATION/PROCEDURE:  Radical irrigation and debridement, right total  knee, with removal of all metallic parts, cement and plastic followed by  placement of a polymethyl methacrylate spacer which was quadruple batch  of Palacos bone cement with 4.8 grams a Tobramycin and 3 grams of  Zinacef, two large bore Hemovac drains. We sent off fluid and tissue  specimens for culture   SURGEON:  Feliberto Gottron. Turner Daniels, M.D.   FIRST ASSISTANT:  Gerrit Halls, PA-C.   SECOND ASSISTANT:  Kirstin McCoy, PA-student.   ANESTHESIA:  General endotracheal anesthesia.   ESTIMATED BLOOD LOSS:  400 mL   FLUID REPLACEMENT:  1500 mL crystalloid.   DRAINS PLACED:  Two large bore hemostats and a Foley catheter.   TOURNIQUET TIME:  1 hour 55 minutes.   INDICATIONS FOR PROCEDURE:  A 75 year old male recently underwent total  knee arthroplasty in 1999, developed an infection about 14 months after  the initial implant and was methicillin-sensitive staph.  We were able  to treat him with irrigation and debridement and changed out the  polyethylene and kept him on Keflex and rifampin for a few years.  Then  for the last 2 years he has been on Keflex only.  He did well until a  few months ago when he developed a lateral abscess in the knee.  A CT  scan was accomplished showing multiple bony cysts consistent with bony  abscesses.  There was no loosening of the implants but because of the  soft tissue abscess, which came back culture negative x3,  probably  because he was on antibiotics, it was felt he would have to be taken for  radical irrigation and debridement and removal of the parts.  His  initial infection with methicillin-sensitive staph aureus   DESCRIPTION OF PROCEDURE:  The patient identified by armband, taken the  operating room at [facility].  Appropriate anesthetic monitors  were attached and general endotracheal anesthesia induced.  A tourniquet  was applied high to the right thigh.  Foot positioner and lateral post  applied to the table and during the prep and drape, the limb was kept  elevated, flexed at the hip about 60 degrees to drain the blood from the  limb.  We then inflated the tourniquet to 350 mmHg.  placed the limb  flap and recreated the initial total knee incision, starting 3 cm below  the tibial tubercle and just medial to the tibial tubercle, going up  over the patella and then proximal to the patella for one handbreadth.  We cut through the skin and subcutaneous tissue and immediately  encountered  the abscess on the lateral side of the knee.  We developed  thick flaps of skin and subcutaneous tissue medially and laterally and  laterally were able to sharply debride the soft tissue abscess.  We then  created a medial parapatellar arthrotomy, entered the knee joint and  encountered dense scar tissue and also reactive synovium consistent with  infection.  There was also bony erosion around the tibia and the femur.  We performed a radical synovectomy of pretty much the entire synovium,  removed the polyethylene spacer  after first elevating the superficial  medial collateral ligament off the tibia and removed dense scar tissue  from the prepatellar fat pad region.  This allowed [place] to evert the  patella and patellar component was removed with a 1/2-inch osteotome.  The cement was removed with osteotomes, rongeurs, an ACL saw a drill and  a high-speed bur.  We did find a large cyst in the superior  lateral  patella and this was thoroughly debrided.  The entire structural  integrity of the patella remained, however.  The knee was then  hyperflexed exposing the femur.  Using a quarter-inch osteotome, the ACL  saw, and wider osteotomes, we then loosened the femoral component off  the femur and extracted it with a extracted it with a slap hammer and  the extractor.  Once again we thoroughly irrigated and debrided the  bone.  Again, using a curets, rongeurs, osteotomes and a saw, we removed  all cement and any obvious necrotic bone from the end of the femur which  structurally was intact except posterolaterally.  About half of that  condylar region was missing.  This allowed [place] to address the tibia and  likewise this was removed with the osteotomes and the ACL saw, as was  the cement on the proximal tibia and going along the Delta fit keel.  A  good hour and half was spent just removing parts and doing a sharp  debridement, curetting and rongeuring.  Satisfied with this, we then  pulse lavaged  3 L of water.  We put a bottle of hydrogen peroxide in  the wounds, did further debridement, pulse lavaged again, and suctioned  and dried the wound.   At the back table a quadruple batch of Palacos polymethyl methacrylate  cement was then mixed with 4.8 grams tobramycin and 3 grams Zinacef and  once it had reached a doughy state and the skin was starting to cure,  while one assistant applied traction on the knee with it bent at 5  degrees, a spacer was molded to the proximal tibia and femur and a  second spacer to the back side of the patella.  Once the cement had  hardened, the tourniquet was let down.  Small bleeders were identified  and cauterized.  Large bore hemostats were placed in the wound.  Parapatellar arthrotomy was closed with a single running #1 Vicryl  suture and the skin and subcutaneous tissue were closed with vertical mattress nylons #1 loosely.  A dressing of Xeroform 4x4  dressing  sponges, Webril and Ace wrap and knee immobilizer was applied.  The  patient was then awakened and taken to recovery room without difficulty.      Feliberto Gottron. Turner Daniels, M.D.  Electronically Signed     FJR/MEDQ  D:  09/21/2006  T:  09/21/2006  Job:  [id number]

## 2011-04-10 LAB — CBC
HCT: 30 — ABNORMAL LOW
MCHC: 33.5
MCV: 84.7
Platelets: 149 — ABNORMAL LOW
Platelets: 149 — ABNORMAL LOW
Platelets: 177
Platelets: 236
RBC: 2.89 — ABNORMAL LOW
RDW: 14.6 — ABNORMAL HIGH
RDW: 14.8 — ABNORMAL HIGH
RDW: 14.9 — ABNORMAL HIGH
WBC: 4.6

## 2011-04-10 LAB — PROTIME-INR
INR: 1.7 — ABNORMAL HIGH
Prothrombin Time: 20.7 — ABNORMAL HIGH
Prothrombin Time: 20.8 — ABNORMAL HIGH

## 2011-04-10 LAB — CULTURE, ROUTINE-ABSCESS

## 2011-04-10 LAB — CROSSMATCH
ABO/RH(D): A POS
Antibody Screen: NEGATIVE

## 2011-04-10 LAB — URINALYSIS, ROUTINE W REFLEX MICROSCOPIC
Leukocytes, UA: NEGATIVE
Nitrite: NEGATIVE
Specific Gravity, Urine: 1.021 (ref 1.005–1.035)
pH: 5.5

## 2011-04-10 LAB — APTT: aPTT: 31

## 2011-04-10 LAB — ANAEROBIC CULTURE: Gram Stain: NONE SEEN

## 2011-04-10 LAB — DIFFERENTIAL
Basophils Relative: 0
Monocytes Absolute: 0.4
Monocytes Relative: 6
Neutro Abs: 4.3

## 2011-04-10 LAB — GRAM STAIN

## 2011-04-10 LAB — COMPREHENSIVE METABOLIC PANEL
ALT: 9
Albumin: 4
Alkaline Phosphatase: 57
BUN: 25 — ABNORMAL HIGH
GFR calc Af Amer: 56 — ABNORMAL LOW
Potassium: 3.8
Sodium: 137
Total Protein: 7

## 2011-04-22 ENCOUNTER — Encounter (INDEPENDENT_AMBULATORY_CARE_PROVIDER_SITE_OTHER): Payer: Self-pay | Admitting: Surgery

## 2011-04-28 ENCOUNTER — Other Ambulatory Visit (INDEPENDENT_AMBULATORY_CARE_PROVIDER_SITE_OTHER): Payer: Self-pay

## 2011-04-28 ENCOUNTER — Encounter (INDEPENDENT_AMBULATORY_CARE_PROVIDER_SITE_OTHER): Payer: Self-pay | Admitting: Surgery

## 2011-04-28 ENCOUNTER — Other Ambulatory Visit (INDEPENDENT_AMBULATORY_CARE_PROVIDER_SITE_OTHER): Payer: Self-pay | Admitting: Surgery

## 2011-04-28 ENCOUNTER — Ambulatory Visit (INDEPENDENT_AMBULATORY_CARE_PROVIDER_SITE_OTHER): Payer: Medicare Other | Admitting: Surgery

## 2011-04-28 VITALS — BP 120/70 | HR 60 | Temp 96.8°F | Resp 18 | Ht 73.0 in | Wt 213.0 lb

## 2011-04-28 DIAGNOSIS — K429 Umbilical hernia without obstruction or gangrene: Secondary | ICD-10-CM | POA: Insufficient documentation

## 2011-04-28 NOTE — Progress Notes (Signed)
Chief Complaint  Patient presents with  . Umbilical Hernia    HPI Jason [name] is a 75 y.o. male.   HPI This is a pleasant gentleman who was seen in our office a little over a year ago for an umbilical hernia. He was seen by Dr. Derrell [name]. He elected to hold on the repair. During the interim he suffered a mild stroke. He is now having increasing symptoms from his umbilical hernia including mild discomfort. He has had no obstructive symptoms. He now wishes to have the hernia repaired Past Medical History  Diagnosis Date  . Hypertension   . Allergic rhinitis, cause unspecified   . Headache   . Pure hyperglyceridemia   . Unspecified cerebral artery occlusion with cerebral infarction   . Unspecified transient cerebral ischemia 02/2010  . Chronic kidney disease   . Organic insomnia, unspecified   . GERD (gastroesophageal reflux disease)     Past Surgical History  Procedure Date  . Knee surgery     x2    History reviewed. No pertinent family history.  Social History History  Substance Use Topics  . Smoking status: Former Games developer  . Smokeless tobacco: Not on file  . Alcohol Use: Yes    No Known Allergies  Current Outpatient Prescriptions  Medication Sig Dispense Refill  . aspirin 325 MG EC tablet Take 325 mg by mouth daily.        . Azelastine HCl (ASTEPRO) 0.15 % SOLN Place into the nose.        . ezetimibe-simvastatin (VYTORIN) 10-10 MG per tablet Take 1 tablet by mouth at bedtime.        . fenofibrate (TRICOR) 48 MG tablet Take 48 mg by mouth daily.        . fluticasone (FLONASE) 50 MCG/ACT nasal spray Place 2 sprays into the nose daily.        Marland Kitchen lisinopril (PRINIVIL,ZESTRIL) 10 MG tablet Take 10 mg by mouth daily.        Marland Kitchen loratadine (CLARITIN) 10 MG tablet Take 10 mg by mouth daily.        Marland Kitchen zolpidem (AMBIEN) 5 MG tablet Take 10 mg by mouth at bedtime as needed.          Review of Systems Review of Systems  Constitutional: Negative for fever, chills and unexpected  weight change.  HENT: Negative for hearing loss, congestion, sore throat, trouble swallowing and voice change.   Eyes: Negative for visual disturbance.  Respiratory: Negative for cough and wheezing.   Cardiovascular: Negative for chest pain, palpitations and leg swelling.  Gastrointestinal: Negative for nausea, vomiting, abdominal pain, diarrhea, constipation, blood in stool, abdominal distention, anal bleeding and rectal pain.  Genitourinary: Negative for hematuria and difficulty urinating.  Musculoskeletal: Negative for arthralgias.  Skin: Negative for rash and wound.  Neurological: Negative for seizures, syncope, weakness and headaches.  Hematological: Negative for adenopathy. Does not bruise/bleed easily.  Psychiatric/Behavioral: Negative for confusion.    Blood pressure 120/70, pulse 60, temperature 96.8 F (36 C), temperature source Temporal, resp. rate 18, height 6\' 1"  (1.854 m), weight 213 lb (96.616 kg).  Physical Exam Physical Exam  Constitutional: He is oriented to person, place, and time. He appears well-developed and well-nourished. No distress.  HENT:  Head: Normocephalic and atraumatic.  Right Ear: External ear normal.  Left Ear: External ear normal.  Nose: Nose normal.  Mouth/Throat: Oropharynx is clear and moist. No oropharyngeal exudate.  Eyes: Conjunctivae are normal. Pupils are equal, round, and reactive to  light. Left eye exhibits no discharge. No scleral icterus.  Neck: Normal range of motion. Neck supple. No tracheal deviation present. No thyromegaly present.  Cardiovascular: Normal rate, regular rhythm, normal heart sounds and intact distal pulses.   No murmur heard. Pulmonary/Chest: Effort normal and breath sounds normal. No respiratory distress. He has no wheezes. He has no rales.  Abdominal: Soft. Bowel sounds are normal. He exhibits no distension. There is no tenderness. There is no rebound. A hernia is present.       There is a reducible umbilical hernia.    Musculoskeletal: Normal range of motion. He exhibits no edema and no tenderness.  Lymphadenopathy:    He has no cervical adenopathy.  Neurological: He is alert and oriented to person, place, and time.  Skin: Skin is warm and dry. No rash noted. No erythema.  Psychiatric: His behavior is normal. Judgment normal.    Data Reviewed   Assessment    Symptomatic umbilical hernia    Plan    Repair is recommended with mesh. I have discussed this with him in detail. I discussed the risk of surgery which includes but is not limited to bleeding, infection, injury to surrounding structures, recurrence, chronic pain, etc. He understands and wished to proceed. Likelihood of success is good       Jason [name] A 04/28/2011, 9:12 AM

## 2011-05-14 ENCOUNTER — Encounter (HOSPITAL_COMMUNITY): Payer: Self-pay | Admitting: Pharmacy Technician

## 2011-05-19 ENCOUNTER — Other Ambulatory Visit: Payer: Self-pay

## 2011-05-19 ENCOUNTER — Ambulatory Visit (HOSPITAL_COMMUNITY)
Admission: RE | Admit: 2011-05-19 | Discharge: 2011-05-19 | Disposition: A | Discharge status: Home / Self Care | Payer: Medicare Other | Source: Ambulatory Visit | Attending: Surgery | Admitting: Surgery

## 2011-05-19 ENCOUNTER — Encounter (HOSPITAL_COMMUNITY): Payer: Self-pay

## 2011-05-19 ENCOUNTER — Encounter (HOSPITAL_COMMUNITY)
Admission: RE | Admit: 2011-05-19 | Discharge: 2011-05-19 | Disposition: A | Discharge status: Home / Self Care | Payer: Medicare Other | Source: Ambulatory Visit | Attending: Surgery | Admitting: Surgery

## 2011-05-19 DIAGNOSIS — Z01810 Encounter for preprocedural cardiovascular examination: Secondary | ICD-10-CM | POA: Insufficient documentation

## 2011-05-19 DIAGNOSIS — I10 Essential (primary) hypertension: Secondary | ICD-10-CM | POA: Insufficient documentation

## 2011-05-19 DIAGNOSIS — K429 Umbilical hernia without obstruction or gangrene: Secondary | ICD-10-CM | POA: Insufficient documentation

## 2011-05-19 DIAGNOSIS — Z01812 Encounter for preprocedural laboratory examination: Secondary | ICD-10-CM | POA: Insufficient documentation

## 2011-05-19 HISTORY — DX: Cerebral infarction, unspecified: I63.9

## 2011-05-19 HISTORY — DX: Acute upper respiratory infection, unspecified: J06.9

## 2011-05-19 LAB — BASIC METABOLIC PANEL
BUN: 39 mg/dL — ABNORMAL HIGH (ref 6–23)
CO2: 26 mEq/L (ref 19–32)
Calcium: 9.8 mg/dL (ref 8.4–10.5)
Creatinine, Ser: 2.25 mg/dL — ABNORMAL HIGH (ref 0.50–1.35)
Glucose, Bld: 109 mg/dL — ABNORMAL HIGH (ref 70–99)

## 2011-05-19 LAB — CBC
Hemoglobin: 13.5 g/dL (ref 13.0–17.0)
MCH: 31.2 pg (ref 26.0–34.0)
MCV: 96.3 fL (ref 78.0–100.0)
RBC: 4.33 MIL/uL (ref 4.22–5.81)

## 2011-05-19 NOTE — Pre-Procedure Instructions (Signed)
20 Jason [name]  05/19/2011   Your procedure is scheduled on  05/21/11  [id number]  Report to [facility] at 0900 AM.  Call this number if you have problems the morning of surgery: [phone]              Deliah Goody  [phone]  Remember:   Do not eat food:After Midnight. Tuesday  May have clear liquids:until Midnight . Tuesday night  Clear liquids include soda, tea, black coffee, apple or grape juice, broth.  Take these medicines the morning of surgery with A SIP OF WATER:none   Do not wear jewelry, make-up or nail polish.  Do not wear lotions, powders, or perfumes. You may wear deodorant.  Do not shave 48 hours prior to surgery.  Do not bring valuables to the hospital.  Contacts, dentures or bridgework may not be worn into surgery.  Leave suitcase in the car. After surgery it may be brought to your room.  For patients admitted to the hospital, checkout time is 11:00 AM the day of discharge.   Patients discharged the day of surgery will not be allowed to drive home.  Name and phone number of your driver: JEWELL WIFE  Special Instructions: CHG Shower Use Special Wash: 1/2 bottle night before surgery and 1/2 bottle morning of surgery.  Regular soap face and privates   Please read over the following fact sheets that you were given: MRSA Information

## 2011-05-19 NOTE — Pre-Procedure Instructions (Signed)
Instructed to notify surgeon about chest congestion

## 2011-05-21 ENCOUNTER — Ambulatory Visit (HOSPITAL_COMMUNITY)
Admission: RE | Admit: 2011-05-21 | Discharge: 2011-05-21 | Disposition: A | Discharge status: Home / Self Care | Payer: Medicare Other | Source: Ambulatory Visit | Attending: Surgery | Admitting: Surgery

## 2011-05-21 ENCOUNTER — Encounter (HOSPITAL_COMMUNITY)
Admission: RE | Disposition: A | Discharge status: Home / Self Care | Payer: Self-pay | Source: Ambulatory Visit | Attending: Surgery

## 2011-05-21 ENCOUNTER — Encounter (HOSPITAL_COMMUNITY): Payer: Self-pay | Admitting: Anesthesiology

## 2011-05-21 ENCOUNTER — Encounter (HOSPITAL_COMMUNITY): Payer: Self-pay

## 2011-05-21 ENCOUNTER — Ambulatory Visit (HOSPITAL_COMMUNITY): Payer: Medicare Other | Admitting: Anesthesiology

## 2011-05-21 DIAGNOSIS — Z79899 Other long term (current) drug therapy: Secondary | ICD-10-CM | POA: Insufficient documentation

## 2011-05-21 DIAGNOSIS — K429 Umbilical hernia without obstruction or gangrene: Secondary | ICD-10-CM | POA: Insufficient documentation

## 2011-05-21 DIAGNOSIS — K219 Gastro-esophageal reflux disease without esophagitis: Secondary | ICD-10-CM | POA: Insufficient documentation

## 2011-05-21 DIAGNOSIS — I10 Essential (primary) hypertension: Secondary | ICD-10-CM | POA: Insufficient documentation

## 2011-05-21 DIAGNOSIS — Z7982 Long term (current) use of aspirin: Secondary | ICD-10-CM | POA: Insufficient documentation

## 2011-05-21 HISTORY — PX: UMBILICAL HERNIA REPAIR: SHX196

## 2011-05-21 SURGERY — REPAIR, HERNIA, UMBILICAL, ADULT
Anesthesia: General | Site: Abdomen | Wound class: Clean

## 2011-05-21 MED ORDER — FENTANYL CITRATE 0.05 MG/ML IJ SOLN
INTRAMUSCULAR | Status: DC | PRN
  Administered 2011-05-21: 100 ug via INTRAVENOUS

## 2011-05-21 MED ORDER — ACETAMINOPHEN 10 MG/ML IV SOLN
INTRAVENOUS | Status: AC
  Filled 2011-05-21: qty 100

## 2011-05-21 MED ORDER — HYDROCODONE-ACETAMINOPHEN 5-325 MG PO TABS
1.0000 | ORAL_TABLET | ORAL | Status: DC | PRN
  Administered 2011-05-21: 1 via ORAL

## 2011-05-21 MED ORDER — HYDROMORPHONE HCL PF 1 MG/ML IJ SOLN: 0.5000 mg | INTRAMUSCULAR | Status: DC | PRN

## 2011-05-21 MED ORDER — ONDANSETRON HCL 4 MG/2ML IJ SOLN: 4.0000 mg | Freq: Four times a day (QID) | INTRAMUSCULAR | Status: DC | PRN

## 2011-05-21 MED ORDER — HYDROCODONE-ACETAMINOPHEN 5-325 MG PO TABS
ORAL_TABLET | ORAL | Status: AC
  Filled 2011-05-21: qty 1

## 2011-05-21 MED ORDER — SODIUM CHLORIDE 0.9 % IV SOLN: 250.0000 mL | INTRAVENOUS | Status: DC | PRN

## 2011-05-21 MED ORDER — LACTATED RINGERS IV SOLN
INTRAVENOUS | Status: DC
  Administered 2011-05-21: 1000 mL via INTRAVENOUS

## 2011-05-21 MED ORDER — SODIUM CHLORIDE 0.9 % IJ SOLN: 3.0000 mL | INTRAMUSCULAR | Status: DC | PRN

## 2011-05-21 MED ORDER — PROPOFOL 10 MG/ML IV EMUL
INTRAVENOUS | Status: DC | PRN
  Administered 2011-05-21: 150 mL via INTRAVENOUS

## 2011-05-21 MED ORDER — LACTATED RINGERS IV SOLN: INTRAVENOUS | Status: DC

## 2011-05-21 MED ORDER — ACETAMINOPHEN 10 MG/ML IV SOLN
INTRAVENOUS | Status: DC | PRN
  Administered 2011-05-21: 1000 mg via INTRAVENOUS

## 2011-05-21 MED ORDER — CEFAZOLIN SODIUM-DEXTROSE 2-3 GM-% IV SOLR
INTRAVENOUS | Status: AC
  Filled 2011-05-21: qty 50

## 2011-05-21 MED ORDER — FENTANYL CITRATE 0.05 MG/ML IJ SOLN: 25.0000 ug | INTRAMUSCULAR | Status: DC | PRN

## 2011-05-21 MED ORDER — CEFAZOLIN SODIUM-DEXTROSE 2-3 GM-% IV SOLR
2.0000 g | Freq: Once | INTRAVENOUS | Status: AC
  Administered 2011-05-21: 2 g via INTRAVENOUS

## 2011-05-21 MED ORDER — BUPIVACAINE LIPOSOME 1.3 % IJ SUSP
20.0000 mL | Freq: Once | INTRAMUSCULAR | Status: AC
  Administered 2011-05-21: 20 mL
  Filled 2011-05-21: qty 20

## 2011-05-21 MED ORDER — PROMETHAZINE HCL 25 MG/ML IJ SOLN: 6.2500 mg | INTRAMUSCULAR | Status: DC | PRN

## 2011-05-21 MED ORDER — PROMETHAZINE HCL 25 MG/ML IJ SOLN: 12.5000 mg | Freq: Four times a day (QID) | INTRAMUSCULAR | Status: DC | PRN

## 2011-05-21 MED ORDER — SODIUM CHLORIDE 0.9 % IJ SOLN: 3.0000 mL | Freq: Two times a day (BID) | INTRAMUSCULAR | Status: DC

## 2011-05-21 MED ORDER — ONDANSETRON HCL 4 MG/2ML IJ SOLN
INTRAMUSCULAR | Status: DC | PRN
  Administered 2011-05-21: 4 mg via INTRAVENOUS

## 2011-05-21 SURGICAL SUPPLY — 34 items
APL SKNCLS STERI-STRIP NONHPOA (GAUZE/BANDAGES/DRESSINGS) ×1
BANDAID FLEXIBLE 1X3 (GAUZE/BANDAGES/DRESSINGS) ×1 IMPLANT
BENZOIN TINCTURE PRP APPL 2/3 (GAUZE/BANDAGES/DRESSINGS) ×1 IMPLANT
BINDER ABD UNIV 12 45-62 (WOUND CARE) IMPLANT
BINDER ABDOMINAL 46IN 62IN (WOUND CARE)
BLADE EXTENDED COATED 6.5IN (ELECTRODE) IMPLANT
BLADE HEX COATED 2.75 (ELECTRODE) ×2 IMPLANT
CANISTER SUCTION 2500CC (MISCELLANEOUS) ×2 IMPLANT
CLOSURE STERI STRIP 1/2 X4 (GAUZE/BANDAGES/DRESSINGS) ×1 IMPLANT
CLOTH BEACON ORANGE TIMEOUT ST (SAFETY) ×2 IMPLANT
DECANTER SPIKE VIAL GLASS SM (MISCELLANEOUS) IMPLANT
DRAPE LAPAROSCOPIC ABDOMINAL (DRAPES) ×2 IMPLANT
ELECT REM PT RETURN 9FT ADLT (ELECTROSURGICAL) ×2
ELECTRODE REM PT RTRN 9FT ADLT (ELECTROSURGICAL) ×1 IMPLANT
GLOVE BIOGEL PI IND STRL 7.0 (GLOVE) ×1 IMPLANT
GLOVE BIOGEL PI INDICATOR 7.0 (GLOVE) ×1
GLOVE SURG SIGNA 7.5 PF LTX (GLOVE) ×2 IMPLANT
GOWN STRL NON-REIN LRG LVL3 (GOWN DISPOSABLE) ×2 IMPLANT
GOWN STRL REIN XL XLG (GOWN DISPOSABLE) ×4 IMPLANT
KIT BASIN OR (CUSTOM PROCEDURE TRAY) ×2 IMPLANT
NEEDLE HYPO 22GX1.5 SAFETY (NEEDLE) ×1 IMPLANT
NS IRRIG 1000ML POUR BTL (IV SOLUTION) ×2 IMPLANT
PACK GENERAL/GYN (CUSTOM PROCEDURE TRAY) ×2 IMPLANT
SPONGE GAUZE 4X4 12PLY (GAUZE/BANDAGES/DRESSINGS) ×2 IMPLANT
STAPLER VISISTAT 35W (STAPLE) IMPLANT
SUT MNCRL AB 4-0 PS2 18 (SUTURE) ×2 IMPLANT
SUT NOVA 1 T20/GS 25DT (SUTURE) IMPLANT
SUT VIC AB 3-0 SH 27 (SUTURE) ×2
SUT VIC AB 3-0 SH 27X BRD (SUTURE) ×1 IMPLANT
SUT VICRYL 2 0 18  UND BR (SUTURE)
SUT VICRYL 2 0 18 UND BR (SUTURE) IMPLANT
SYR CONTROL 10ML LL (SYRINGE) ×1 IMPLANT
TOWEL OR 17X26 10 PK STRL BLUE (TOWEL DISPOSABLE) ×2 IMPLANT
TRAY FOLEY CATH 14FRSI W/METER (CATHETERS) IMPLANT

## 2011-05-21 NOTE — Anesthesia Preprocedure Evaluation (Addendum)
Anesthesia Evaluation  Patient identified by MRN, date of birth, ID band Patient awake    Reviewed: Allergy & Precautions, H&P , NPO status , Patient's Chart, lab work & pertinent test results  Airway Mallampati: II TM Distance: >3 FB Neck ROM: Full    Dental No notable dental hx. (+) Dental Advisory Given and Teeth Intact   Pulmonary Recent URI , Residual Cough,  clear to auscultation  Pulmonary exam normal       Cardiovascular hypertension, Pt. on medications - Orthopnea Regular Normal    Neuro/Psych CVA, No Residual Symptoms Negative Psych ROS   GI/Hepatic negative GI ROS, Neg liver ROS,   Endo/Other  Negative Endocrine ROS  Renal/GU negative Renal ROS  Genitourinary negative   Musculoskeletal negative musculoskeletal ROS (+)   Abdominal   Peds negative pediatric ROS (+)  Hematology negative hematology ROS (+)   Anesthesia Other Findings   Reproductive/Obstetrics negative OB ROS                          Anesthesia Physical Anesthesia Plan  ASA: II  Anesthesia Plan: General   Post-op Pain Management:    Induction: Intravenous  Airway Management Planned: LMA  Additional Equipment:   Intra-op Plan:   Post-operative Plan:   Informed Consent: I have reviewed the patients History and Physical, chart, labs and discussed the procedure including the risks, benefits and alternatives for the proposed anesthesia with the patient or authorized representative who has indicated his/her understanding and acceptance.   Dental advisory given  Plan Discussed with: CRNA and Surgeon  Anesthesia Plan Comments:         Anesthesia Quick Evaluation

## 2011-05-21 NOTE — Transfer of Care (Signed)
Immediate Anesthesia Transfer of Care Note  Patient: Jason [name]  Procedure(s) Performed:  HERNIA REPAIR UMBILICAL ADULT  Patient Location: PACU  Anesthesia Type: General  Level of Consciousness: awake, alert  and sedated  Airway & Oxygen Therapy: Patient Spontanous Breathing and Patient connected to face mask oxygen  Post-op Assessment: Report given to PACU RN and Post -op Vital signs reviewed and stable  Post vital signs: Reviewed and stable  Complications: No apparent anesthesia complications

## 2011-05-21 NOTE — Progress Notes (Signed)
Up to BR with help and voided well 

## 2011-05-21 NOTE — Interval H&P Note (Signed)
History and Physical Interval Note:   05/21/2011   7:26 AM   Jason [name]  has presented today for surgery, with the diagnosis of Umbilical hernia repair with mesh  The various methods of treatment have been discussed with the patient and family. After consideration of risks, benefits and other options for treatment, the patient has consented to  Procedure(s): HERNIA REPAIR UMBILICAL ADULT as a surgical intervention .  The patients' history has been reviewed, patient examined, no change in status, stable for surgery.  I have reviewed the patients' chart and labs.  Questions were answered to the patient's satisfaction.     Abigail Miyamoto A  MD

## 2011-05-21 NOTE — Op Note (Signed)
05/21/2011  11:27 AM  PATIENT:  Jason [name]  75 y.o. male  PRE-OPERATIVE DIAGNOSIS:  Umbilical hernia   POST-OPERATIVE DIAGNOSIS:  Umbilical hernia   PROCEDURE:  Procedure(s): HERNIA REPAIR UMBILICAL ADULT  SURGEON:  Surgeon(s): Shelly Rubenstein, MD  PHYSICIAN ASSISTANT:   ASSISTANTS: none   ANESTHESIA:   local and general  EBL:  Total I/O In: 200 [I.V.:200] Out: -   BLOOD ADMINISTERED:none  DRAINS: none   LOCAL MEDICATIONS USED:  BUPIVICAINE 20CC  SPECIMEN:  No Specimen  DISPOSITION OF SPECIMEN:  N/A  COUNTS:  YES  TOURNIQUET:  * No tourniquets in log *  DICTATION: .Dragon Dictation The patient was brought to the operating room and identified as the correct patient. He was placed supine on the operating room table and general anesthesia was induced. His abdomen was then prepped and draped in the usual sterile fashion. Using a scalpel a small transverse incision was made above the umbilicus. This is carried down to the fascial with electric cautery. The patient had a very small umbilical hernia defect which was approximately 5 mm in size. It was found to contain a small sac containing preperitoneal fat which I completely excised with the cautery. As the defect was so small I elected not to use mesh. I closed the small fascial defect with interrupted #1 Novafil sutures. I then anesthetized the fascia and skin with bupivacaine. I closed subcutaneous tissue with interrupted 3-0 Vicryl sutures and closed the skin with a running 4-0 Monocryl suture. Steri-Strips and a Band-Aid were then applied. The patient tolerated the procedure well.All counts correct at the end of the procedure. The patient was then extubated in the operating room and taken in a stable condition to the recovery room. PLAN OF CARE: Discharge to home after PACU  PATIENT DISPOSITION:  PACU - hemodynamically stable.   Delay start of Pharmacological VTE agent (>24hrs) due to surgical blood loss or risk  of bleeding:  {YES/NO/NOT APPLICABLE:20182

## 2011-05-21 NOTE — Progress Notes (Signed)
Walked in hall and tol well

## 2011-05-21 NOTE — Discharge Instructions (Signed)
[facility] _______[facility]  UMBILICAL OR INGUINAL HERNIA REPAIR: POST OP INSTRUCTIONS  Always review your discharge instruction sheet given to you by the facility where your surgery was performed. IF YOU HAVE DISABILITY OR FAMILY LEAVE FORMS, YOU MUST BRING THEM TO THE OFFICE FOR PROCESSING.   DO NOT GIVE THEM TO YOUR DOCTOR.  1. A  prescription for pain medication may be given to you upon discharge.  Take your pain medication as prescribed, if needed.  If narcotic pain medicine is not needed, then you may take acetaminophen (Tylenol) or ibuprofen (Advil) as needed. 2. Take your usually prescribed medications unless otherwise directed. 3. If you need a refill on your pain medication, please contact your pharmacy.  They will contact our office to request authorization. Prescriptions will not be filled after 5 pm or on week-ends. 4. You should follow a light diet the first 24 hours after arrival home, such as soup and crackers, etc.  Be sure to include lots of fluids daily.  Resume your normal diet the day after surgery. 5. Most patients will experience some swelling and bruising around the umbilicus or in the groin and scrotum.  Ice packs and reclining will help.  Swelling and bruising can take several days to resolve.  6. It is common to experience some constipation if taking pain medication after surgery.  Increasing fluid intake and taking a stool softener (such as Colace) will usually help or prevent this problem from occurring.  A mild laxative (Milk of Magnesia or Miralax) should be taken according to package directions if there are no bowel movements after 48 hours. 7. Unless discharge instructions indicate otherwise, you may remove your bandages 24-48 hours after surgery, and you may shower at that time.  You may have steri-strips (small skin tapes) in place directly over the incision.  These strips should be left on the skin for 7-10 days.  If your surgeon used skin glue on the  incision, you may shower in 24 hours.  The glue will flake off over the next 2-3 weeks.  Any sutures or staples will be removed at the office during your follow-up visit. 8. ACTIVITIES:  You may resume regular (light) daily activities beginning the next day--such as daily self-care, walking, climbing stairs--gradually increasing activities as tolerated.  You may have sexual intercourse when it is comfortable.  Refrain from any heavy lifting or straining until approved by your doctor. a. You may drive when you are no longer taking prescription pain medication, you can comfortably wear a seatbelt, and you can safely maneuver your car and apply brakes. b. RETURN TO WORK:  __________________________________________________________ 9. You should see your doctor in the office for a follow-up appointment approximately 2-3 weeks after your surgery.  Make sure that you call for this appointment within a day or two after you arrive home to insure a convenient appointment time. 10. OTHER INSTRUCTIONS:  __________________________________________________________________________________________________________________________________________________________________________________________  WHEN TO CALL YOUR DOCTOR: 1. Fever over 101.0 2. Inability to urinate 3. Nausea and/or vomiting 4. Extreme swelling or bruising 5. Continued bleeding from incision. 6. Increased pain, redness, or drainage from the incision  The clinic staff is available to answer your questions during regular business hours.  Please don't hesitate to call and ask to speak to one of the nurses for clinical concerns.  If you have a medical emergency, go to the nearest emergency room or call 911.  A surgeon from [facility] is always on call at the hospital     [address], [address], [place], [place]  [address] ?  [address] [address], [place], [place]   [address] [phone] ? [phone] ? FAX [phone] Web site:  [email or web address]  

## 2011-05-21 NOTE — Anesthesia Postprocedure Evaluation (Signed)
  Anesthesia Post-op Note  Patient: Jason [name]  Procedure(s) Performed:  HERNIA REPAIR UMBILICAL ADULT  Patient Location: PACU  Anesthesia Type: General  Level of Consciousness: awake and alert   Airway and Oxygen Therapy: Patient Spontanous Breathing  Post-op Pain: mild  Post-op Assessment: Post-op Vital signs reviewed, Patient's Cardiovascular Status Stable, Respiratory Function Stable, Patent Airway and No signs of Nausea or vomiting  Post-op Vital Signs: stable  Complications: No apparent anesthesia complications

## 2011-05-21 NOTE — H&P (View-Only) (Signed)
Chief Complaint  Patient presents with  . Umbilical Hernia    HPI Jason [name] is a 75 y.o. male.   HPI This is a pleasant gentleman who was seen in our office a little over a year ago for an umbilical hernia. He was seen by Dr. Ingram. He elected to hold on the repair. During the interim he suffered a mild stroke. He is now having increasing symptoms from his umbilical hernia including mild discomfort. He has had no obstructive symptoms. He now wishes to have the hernia repaired Past Medical History  Diagnosis Date  . Hypertension   . Allergic rhinitis, cause unspecified   . Headache   . Pure hyperglyceridemia   . Unspecified cerebral artery occlusion with cerebral infarction   . Unspecified transient cerebral ischemia 02/2010  . Chronic kidney disease   . Organic insomnia, unspecified   . GERD (gastroesophageal reflux disease)     Past Surgical History  Procedure Date  . Knee surgery     x2    History reviewed. No pertinent family history.  Social History History  Substance Use Topics  . Smoking status: Former Smoker  . Smokeless tobacco: Not on file  . Alcohol Use: Yes    No Known Allergies  Current Outpatient Prescriptions  Medication Sig Dispense Refill  . aspirin 325 MG EC tablet Take 325 mg by mouth daily.        . Azelastine HCl (ASTEPRO) 0.15 % SOLN Place into the nose.        . ezetimibe-simvastatin (VYTORIN) 10-10 MG per tablet Take 1 tablet by mouth at bedtime.        . fenofibrate (TRICOR) 48 MG tablet Take 48 mg by mouth daily.        . fluticasone (FLONASE) 50 MCG/ACT nasal spray Place 2 sprays into the nose daily.        . lisinopril (PRINIVIL,ZESTRIL) 10 MG tablet Take 10 mg by mouth daily.        . loratadine (CLARITIN) 10 MG tablet Take 10 mg by mouth daily.        . zolpidem (AMBIEN) 5 MG tablet Take 10 mg by mouth at bedtime as needed.          Review of Systems Review of Systems  Constitutional: Negative for fever, chills and unexpected  weight change.  HENT: Negative for hearing loss, congestion, sore throat, trouble swallowing and voice change.   Eyes: Negative for visual disturbance.  Respiratory: Negative for cough and wheezing.   Cardiovascular: Negative for chest pain, palpitations and leg swelling.  Gastrointestinal: Negative for nausea, vomiting, abdominal pain, diarrhea, constipation, blood in stool, abdominal distention, anal bleeding and rectal pain.  Genitourinary: Negative for hematuria and difficulty urinating.  Musculoskeletal: Negative for arthralgias.  Skin: Negative for rash and wound.  Neurological: Negative for seizures, syncope, weakness and headaches.  Hematological: Negative for adenopathy. Does not bruise/bleed easily.  Psychiatric/Behavioral: Negative for confusion.    Blood pressure 120/70, pulse 60, temperature 96.8 F (36 C), temperature source Temporal, resp. rate 18, height 6' 1" (1.854 m), weight 213 lb (96.616 kg).  Physical Exam Physical Exam  Constitutional: He is oriented to person, place, and time. He appears well-developed and well-nourished. No distress.  HENT:  Head: Normocephalic and atraumatic.  Right Ear: External ear normal.  Left Ear: External ear normal.  Nose: Nose normal.  Mouth/Throat: Oropharynx is clear and moist. No oropharyngeal exudate.  Eyes: Conjunctivae are normal. Pupils are equal, round, and reactive to   light. Left eye exhibits no discharge. No scleral icterus.  Neck: Normal range of motion. Neck supple. No tracheal deviation present. No thyromegaly present.  Cardiovascular: Normal rate, regular rhythm, normal heart sounds and intact distal pulses.   No murmur heard. Pulmonary/Chest: Effort normal and breath sounds normal. No respiratory distress. He has no wheezes. He has no rales.  Abdominal: Soft. Bowel sounds are normal. He exhibits no distension. There is no tenderness. There is no rebound. A hernia is present.       There is a reducible umbilical hernia.    Musculoskeletal: Normal range of motion. He exhibits no edema and no tenderness.  Lymphadenopathy:    He has no cervical adenopathy.  Neurological: He is alert and oriented to person, place, and time.  Skin: Skin is warm and dry. No rash noted. No erythema.  Psychiatric: His behavior is normal. Judgment normal.    Data Reviewed   Assessment    Symptomatic umbilical hernia    Plan    Repair is recommended with mesh. I have discussed this with him in detail. I discussed the risk of surgery which includes but is not limited to bleeding, infection, injury to surrounding structures, recurrence, chronic pain, etc. He understands and wished to proceed. Likelihood of success is good       [name] A 04/28/2011, 9:12 AM    

## 2011-05-23 ENCOUNTER — Encounter (HOSPITAL_COMMUNITY): Payer: Self-pay | Admitting: Surgery

## 2011-06-09 ENCOUNTER — Ambulatory Visit (INDEPENDENT_AMBULATORY_CARE_PROVIDER_SITE_OTHER): Payer: Medicare Other | Admitting: Surgery

## 2011-06-09 ENCOUNTER — Encounter (INDEPENDENT_AMBULATORY_CARE_PROVIDER_SITE_OTHER): Payer: Self-pay | Admitting: Surgery

## 2011-06-09 VITALS — BP 118/68 | HR 60 | Temp 97.3°F | Resp 16 | Ht 73.0 in | Wt 213.0 lb

## 2011-06-09 DIAGNOSIS — Z09 Encounter for follow-up examination after completed treatment for conditions other than malignant neoplasm: Secondary | ICD-10-CM

## 2011-06-09 NOTE — Progress Notes (Signed)
Subjective:     Patient ID: Jason [name], male   DOB: 1935-12-31, 75 y.o.   MRN: [id number]  HPI  He is here for his first postoperative visit status post umbilical hernia repair. He is doing well has no complaints. Review of Systems     Objective:   Physical Exam On exam, his incision is healing well. There is no evidence of infection or recurrence  Assessment:     Patient status post umbilical hernia repair    Plan:     He will refrain from heavy lifting for one more week. I will see him back as needed

## 2012-06-05 ENCOUNTER — Emergency Department (HOSPITAL_BASED_OUTPATIENT_CLINIC_OR_DEPARTMENT_OTHER): Payer: Medicare Other

## 2012-06-05 ENCOUNTER — Encounter (HOSPITAL_BASED_OUTPATIENT_CLINIC_OR_DEPARTMENT_OTHER): Payer: Self-pay | Admitting: Emergency Medicine

## 2012-06-05 ENCOUNTER — Emergency Department (HOSPITAL_BASED_OUTPATIENT_CLINIC_OR_DEPARTMENT_OTHER)
Admission: EM | Admit: 2012-06-05 | Discharge: 2012-06-05 | Disposition: A | Discharge status: Home / Self Care | Payer: Medicare Other | Attending: Emergency Medicine | Admitting: Emergency Medicine

## 2012-06-05 DIAGNOSIS — R110 Nausea: Secondary | ICD-10-CM | POA: Insufficient documentation

## 2012-06-05 DIAGNOSIS — Z79899 Other long term (current) drug therapy: Secondary | ICD-10-CM | POA: Insufficient documentation

## 2012-06-05 DIAGNOSIS — Z7982 Long term (current) use of aspirin: Secondary | ICD-10-CM | POA: Insufficient documentation

## 2012-06-05 DIAGNOSIS — K8050 Calculus of bile duct without cholangitis or cholecystitis without obstruction: Secondary | ICD-10-CM

## 2012-06-05 DIAGNOSIS — Z8709 Personal history of other diseases of the respiratory system: Secondary | ICD-10-CM | POA: Insufficient documentation

## 2012-06-05 DIAGNOSIS — E781 Pure hyperglyceridemia: Secondary | ICD-10-CM | POA: Insufficient documentation

## 2012-06-05 DIAGNOSIS — Z96659 Presence of unspecified artificial knee joint: Secondary | ICD-10-CM | POA: Insufficient documentation

## 2012-06-05 DIAGNOSIS — K8020 Calculus of gallbladder without cholecystitis without obstruction: Secondary | ICD-10-CM | POA: Insufficient documentation

## 2012-06-05 DIAGNOSIS — Z9889 Other specified postprocedural states: Secondary | ICD-10-CM | POA: Insufficient documentation

## 2012-06-05 DIAGNOSIS — Z8719 Personal history of other diseases of the digestive system: Secondary | ICD-10-CM | POA: Insufficient documentation

## 2012-06-05 DIAGNOSIS — Z8673 Personal history of transient ischemic attack (TIA), and cerebral infarction without residual deficits: Secondary | ICD-10-CM | POA: Insufficient documentation

## 2012-06-05 DIAGNOSIS — Z8679 Personal history of other diseases of the circulatory system: Secondary | ICD-10-CM | POA: Insufficient documentation

## 2012-06-05 DIAGNOSIS — J309 Allergic rhinitis, unspecified: Secondary | ICD-10-CM | POA: Insufficient documentation

## 2012-06-05 DIAGNOSIS — N289 Disorder of kidney and ureter, unspecified: Secondary | ICD-10-CM | POA: Insufficient documentation

## 2012-06-05 DIAGNOSIS — Z87891 Personal history of nicotine dependence: Secondary | ICD-10-CM | POA: Insufficient documentation

## 2012-06-05 DIAGNOSIS — N189 Chronic kidney disease, unspecified: Secondary | ICD-10-CM | POA: Insufficient documentation

## 2012-06-05 DIAGNOSIS — I129 Hypertensive chronic kidney disease with stage 1 through stage 4 chronic kidney disease, or unspecified chronic kidney disease: Secondary | ICD-10-CM | POA: Insufficient documentation

## 2012-06-05 LAB — COMPREHENSIVE METABOLIC PANEL
AST: 15 U/L (ref 0–37)
Albumin: 4.1 g/dL (ref 3.5–5.2)
Calcium: 10.2 mg/dL (ref 8.4–10.5)
Creatinine, Ser: 2.3 mg/dL — ABNORMAL HIGH (ref 0.50–1.35)
GFR calc non Af Amer: 26 mL/min — ABNORMAL LOW (ref >90)
Sodium: 138 mEq/L (ref 135–145)
Total Protein: 7.8 g/dL (ref 6.0–8.3)

## 2012-06-05 LAB — CBC WITH DIFFERENTIAL/PLATELET
Basophils Absolute: 0 10*3/uL (ref 0.0–0.1)
Basophils Relative: 0 % (ref 0–1)
Eosinophils Absolute: 0 10*3/uL (ref 0.0–0.7)
Eosinophils Relative: 0 % (ref 0–5)
HCT: 43.4 % (ref 39.0–52.0)
MCHC: 33.2 g/dL (ref 30.0–36.0)
MCV: 93.7 fL (ref 78.0–100.0)
Monocytes Absolute: 0.8 10*3/uL (ref 0.1–1.0)
Platelets: 174 10*3/uL (ref 150–400)
RDW: 14.4 % (ref 11.5–15.5)

## 2012-06-05 LAB — URINALYSIS, ROUTINE W REFLEX MICROSCOPIC
Glucose, UA: NEGATIVE mg/dL
Leukocytes, UA: NEGATIVE
Protein, ur: NEGATIVE mg/dL
Specific Gravity, Urine: 1.021 (ref 1.005–1.030)

## 2012-06-05 MED ORDER — CIPROFLOXACIN HCL 500 MG PO TABS: 500.0000 mg | ORAL_TABLET | Freq: Every day | ORAL | Status: DC

## 2012-06-05 MED ORDER — OXYCODONE-ACETAMINOPHEN 5-325 MG PO TABS: 1.0000 | ORAL_TABLET | ORAL | Status: DC | PRN

## 2012-06-05 MED ORDER — MORPHINE SULFATE 4 MG/ML IJ SOLN
4.0000 mg | Freq: Once | INTRAMUSCULAR | Status: AC
  Administered 2012-06-05: 4 mg via INTRAVENOUS
  Filled 2012-06-05: qty 1

## 2012-06-05 MED ORDER — PIPERACILLIN-TAZOBACTAM 3.375 G IVPB
3.3750 g | Freq: Once | INTRAVENOUS | Status: AC
  Administered 2012-06-05: 3.375 g via INTRAVENOUS
  Filled 2012-06-05: qty 50

## 2012-06-05 MED ORDER — ONDANSETRON 8 MG PO TBDP
8.0000 mg | ORAL_TABLET | Freq: Once | ORAL | Status: AC
  Administered 2012-06-05: 8 mg via ORAL
  Filled 2012-06-05: qty 1

## 2012-06-05 MED ORDER — IOHEXOL 300 MG/ML  SOLN
25.0000 mL | INTRAMUSCULAR | Status: AC
  Administered 2012-06-05 (×2): 25 mL via ORAL

## 2012-06-05 NOTE — ED Provider Notes (Signed)
History     CSN: [id number]  Arrival date & time 06/05/12  1018   First MD Initiated Contact with Patient 06/05/12 1033      Chief Complaint  Patient presents with  . Abdominal Pain     Patient is a 76 y.o. male presenting with abdominal pain. The history is provided by the patient.  Abdominal Pain The primary symptoms of the illness include abdominal pain and nausea. The primary symptoms of the illness do not include fever, shortness of breath, vomiting or dysuria. The current episode started yesterday. The onset of the illness was sudden. The problem has been gradually improving.  The patient has not had a change in bowel habit. Symptoms associated with the illness do not include chills, diaphoresis, constipation or back pain.  pt reports periumbilical abdominal pain that started while sleeping over 24 hours ago The pain does not radiate It is improved Denies cp/sob/weakness.  No vomiting/diarrhea No focal weakness He has never had this before  Past Medical History  Diagnosis Date  . Allergic rhinitis, cause unspecified   . Headache   . Pure hyperglyceridemia   . Unspecified cerebral artery occlusion with cerebral infarction   . Unspecified transient cerebral ischemia 02/2010  . Chronic kidney disease   . Organic insomnia, unspecified   . GERD (gastroesophageal reflux disease)   . Stroke     left basal ganglia infarction 9/11- LOV DR Pearlean Brownie 7/12 on chart  . Hypertension     stress test 5 years ago, eccho 9/11 on chart  . Recurrent upper respiratory infection (URI)     states chest congestion x 3-4 weeks, no fever- states was productive small amt yellow phlegm, now is clear    Past Surgical History  Procedure Date  . Knee surgery     x2  . Joint replacement     right total knee replacement x 2  . Tonsillectomy   . Umbilical hernia repair 05/21/2011    Procedure: HERNIA REPAIR UMBILICAL ADULT;  Surgeon: Shelly Rubenstein, MD;  Location: [facility];  Service: General;   Laterality: N/A;    No family history on file.  History  Substance Use Topics  . Smoking status: Former Smoker -- 2.0 packs/day for 10 years  . Smokeless tobacco: Former Neurosurgeon    Quit date: 05/18/1989  . Alcohol Use: 3.6 oz/week    3 Glasses of wine, 3 Shots of liquor per week      Review of Systems  Constitutional: Negative for fever, chills and diaphoresis.  Respiratory: Negative for shortness of breath.   Cardiovascular: Negative for chest pain.  Gastrointestinal: Positive for nausea and abdominal pain. Negative for vomiting, constipation and blood in stool.  Genitourinary: Negative for dysuria and flank pain.  Musculoskeletal: Negative for back pain.  Neurological: Negative for weakness.  All other systems reviewed and are negative.    Allergies  Review of patient's allergies indicates no known allergies.  Home Medications   Current Outpatient Rx  Name  Route  Sig  Dispense  Refill  . ASPIRIN 325 MG PO TBEC   Oral   Take 325 mg by mouth every morning.          . AZELASTINE HCL 0.15 % NA SOLN   Nasal   Place 2 sprays into the nose daily as needed. Allergies          . EZETIMIBE-SIMVASTATIN 10-10 MG PO TABS   Oral   Take 1 tablet by mouth every morning.          Marland Kitchen  FENOFIBRATE 48 MG PO TABS   Oral   Take 48 mg by mouth every morning.          Marland Kitchen LISINOPRIL 10 MG PO TABS   Oral   Take 10 mg by mouth every morning.          Marland Kitchen ZOLPIDEM TARTRATE 5 MG PO TABS   Oral   Take 10 mg by mouth at bedtime as needed. Sleep            BP 141/77  Pulse 60  Temp 97.7 F (36.5 C) (Oral)  Resp 16  Ht 6\' 1"  (1.854 m)  Wt 212 lb (96.163 kg)  BMI 27.97 kg/m2  SpO2 100%  Physical Exam CONSTITUTIONAL: Well developed/well nourished HEAD AND FACE: Normocephalic/atraumatic EYES: EOMI/PERRL ENMT: Mucous membranes moist NECK: supple no meningeal signs SPINE:entire spine nontender CV: S1/S2 noted, no murmurs/rubs/gallops noted LUNGS: Lungs are clear to  auscultation bilaterally, no apparent distress ABDOMEN: soft, nontender, no rebound or guarding, +BS No abdominal hernia is noted GU:no cva tenderness NEURO: Pt is awake/alert, moves all extremitiesx4 EXTREMITIES: pulses normal, full ROM SKIN: warm, color normal PSYCH: no abnormalities of mood noted  ED Course  Procedures  Nursing notes including past medical history and social history reviewed and considered in documentation Labs/vital reviewed and considered  11:19 AM Pt very well appearing.  Will try PO challenge.  Will check labs and reassess Will defer imaging for now 12:12 PM Pt now with return of abdominal pain Will obtain CT imaging without IV contrast 2:34 PM CT scan shows probable acute cholecystitis D/w dr Abbey Chatters via OR nurse We reviewed imaging and labs He feels patient can be discharged on antibiotics and f/u in 48 hours since his clinical exam is not c/w cholecystitis, afebrile and without leukocytosis. 3:03 PM Pt reports his pain is improved.  He is well appearing, nontoxic I spoke to his PCP (dr Everlene Other) he is well known to patient.  He would be happy to f/u by phone tomorrow and in office Monday Plan is to load with antibiotics here.  Will call back to surgery to ensure followup 3:15 PM D/w dr Abbey Chatters, he reviewed CT findings.  Recommends cipro daily for one week Does not need emergent cholecystectomy especially if pain improved, which pt is improved He can f/u in one week  4:15 PM Pt stable No current pain No fever and no vomiting He wants to go home Will see his PCP in 24 hours Dose the cipro for his renal dysfunction.  D/w pharmacy Referred to surgery in one week Discussed strict return precautions  MDM  Nursing notes including past medical history and social history reviewed and considered in documentation Labs/vital reviewed and considered        Date: 06/05/2012  Rate: 65  Rhythm: normal sinus rhythm  QRS Axis: normal  Intervals:  normal  ST/T Wave abnormalities: nonspecific ST changes  Conduction Disutrbances:none  Narrative Interpretation:   Old EKG Reviewed: unchanged    Joya Gaskins, MD 06/05/12 1616

## 2012-06-05 NOTE — ED Notes (Signed)
Pt's RAC improved, less redness and swelling noted, still pain free

## 2012-06-05 NOTE — ED Notes (Signed)
C/o abd pain without V/D since last night, no urinary s/s, no other complaints, no meds pta, NAD

## 2012-06-05 NOTE — ED Notes (Signed)
Pt's R AC IV infiltrated, area is red and mildly swollen, pt denies any pain, IV dc and restarted in LAC for abx, warm compress applied

## 2012-06-07 ENCOUNTER — Encounter (INDEPENDENT_AMBULATORY_CARE_PROVIDER_SITE_OTHER): Payer: Self-pay | Admitting: General Surgery

## 2012-06-08 ENCOUNTER — Encounter (INDEPENDENT_AMBULATORY_CARE_PROVIDER_SITE_OTHER): Payer: Self-pay | Admitting: General Surgery

## 2012-06-08 ENCOUNTER — Ambulatory Visit (INDEPENDENT_AMBULATORY_CARE_PROVIDER_SITE_OTHER): Payer: Medicare Other | Admitting: General Surgery

## 2012-06-08 VITALS — BP 100/60 | HR 84 | Temp 96.0°F | Resp 24 | Ht 73.0 in | Wt 208.0 lb

## 2012-06-08 DIAGNOSIS — K8020 Calculus of gallbladder without cholecystitis without obstruction: Secondary | ICD-10-CM

## 2012-06-08 NOTE — Progress Notes (Signed)
Patient ID: Jason [name], male   DOB: March 06, 1936, 76 y.o.   MRN: [id number]  Chief Complaint  Patient presents with  . Abdominal Pain    HPI Jason [name] is a 77 y.o. male referred by Dr. Bebe Shaggy for evaluation of gallstones.  The patient states the right upper quadrant epigastric pain started approximately Thursday of last week. Patient noted that his physician and was seen is on antibiotics. Patient underwent CT scan at urgent care Center which revealed gallstones that were large and the gallbladder was thickened gallbladder wall. Patient's labs were within normal limits. Patient since that time has had no abdominal pain but states his pain medication. HPI  Past Medical History  Diagnosis Date  . Allergic rhinitis, cause unspecified   . Headache   . Pure hyperglyceridemia   . Unspecified cerebral artery occlusion with cerebral infarction   . Unspecified transient cerebral ischemia 02/2010  . Chronic kidney disease   . Organic insomnia, unspecified   . GERD (gastroesophageal reflux disease)   . Stroke     left basal ganglia infarction 9/11- LOV DR Pearlean Brownie 7/12 on chart  . Hypertension     stress test 5 years ago, eccho 9/11 on chart  . Recurrent upper respiratory infection (URI)     states chest congestion x 3-4 weeks, no fever- states was productive small amt yellow phlegm, now is clear    Past Surgical History  Procedure Date  . Knee surgery     x2  . Joint replacement     right total knee replacement x 2  . Tonsillectomy   . Umbilical hernia repair 05/21/2011    Procedure: HERNIA REPAIR UMBILICAL ADULT;  Surgeon: Shelly Rubenstein, MD;  Location: [facility];  Service: General;  Laterality: N/A;    No family history on file.  Social History History  Substance Use Topics  . Smoking status: Former Smoker -- 2.0 packs/day for 10 years  . Smokeless tobacco: Former Neurosurgeon    Quit date: 05/18/1989  . Alcohol Use: 3.6 oz/week    3 Glasses of wine, 3 Shots of liquor per  week    No Known Allergies  Current Outpatient Prescriptions  Medication Sig Dispense Refill  . aspirin 325 MG EC tablet Take 325 mg by mouth every morning.       . Azelastine HCl (ASTEPRO) 0.15 % SOLN Place 2 sprays into the nose daily as needed. Allergies       . ciprofloxacin (CIPRO) 500 MG tablet Take 1 tablet (500 mg total) by mouth daily.  7 tablet  0  . ezetimibe-simvastatin (VYTORIN) 10-10 MG per tablet Take 1 tablet by mouth every morning.       . fenofibrate (TRICOR) 48 MG tablet Take 48 mg by mouth every morning.       Marland Kitchen lisinopril (PRINIVIL,ZESTRIL) 10 MG tablet Take 10 mg by mouth every morning.       Marland Kitchen oxyCODONE-acetaminophen (PERCOCET/ROXICET) 5-325 MG per tablet Take 1 tablet by mouth every 4 (four) hours as needed for pain.  15 tablet  0  . zolpidem (AMBIEN) 5 MG tablet Take 10 mg by mouth at bedtime as needed. Sleep         Review of Systems Review of Systems  Constitutional: Negative.   HENT: Negative.   Eyes: Negative.   Cardiovascular: Negative.   Gastrointestinal: Positive for abdominal pain.  Musculoskeletal: Negative.   Neurological: Negative.     Blood pressure 100/60, pulse 84, resp. rate 24, height 6'  1" (1.854 m), weight 208 lb (94.348 kg).  Physical Exam Physical Exam  Constitutional: He is oriented to person, place, and time. He appears well-developed and well-nourished.  HENT:  Head: Normocephalic and atraumatic.  Eyes: Conjunctivae normal and EOM are normal. Pupils are equal, round, and reactive to light.  Neck: Normal range of motion. Neck supple.  Cardiovascular: Normal rate and normal heart sounds.   Pulmonary/Chest: Effort normal.  Abdominal: Soft. Bowel sounds are normal. He exhibits no mass. There is tenderness (ruq). There is no rebound and no guarding.  Musculoskeletal: Normal range of motion.  Neurological: He is alert and oriented to person, place, and time.    Data Reviewed Ultrasound due revealed large gallstones with  gallbladder thickened gallbladder wall. LFTs within normal limits.  Assessment    Patient is a 76 year old male with symptomatic cholelithiasis.    Plan    1. We'll proceed operating room for laparoscopic cholecystectomy with intraoperative cholangiogram.  2.All risks and benefits were discussed with the patient, to generally include infection, bleeding, damage to surrounding structures, and recurrence. Alternatives were offered and described.  All questions were answered and the patient voiced understanding of the procedure and wishes to proceed at this point.        Marigene Ehlers., Gerldine Suleiman 06/08/2012, 1:45 PM

## 2012-06-09 ENCOUNTER — Encounter (HOSPITAL_COMMUNITY): Payer: Self-pay | Admitting: Respiratory Therapy

## 2012-06-10 ENCOUNTER — Encounter (HOSPITAL_COMMUNITY)
Admission: RE | Admit: 2012-06-10 | Discharge: 2012-06-10 | Disposition: A | Discharge status: Home / Self Care | Payer: Medicare Other | Source: Ambulatory Visit | Attending: Anesthesiology | Admitting: Anesthesiology

## 2012-06-10 ENCOUNTER — Encounter (HOSPITAL_COMMUNITY): Payer: Self-pay

## 2012-06-10 ENCOUNTER — Encounter (HOSPITAL_COMMUNITY)
Admission: RE | Admit: 2012-06-10 | Discharge: 2012-06-10 | Disposition: A | Discharge status: Home / Self Care | Payer: Medicare Other | Source: Ambulatory Visit | Attending: General Surgery | Admitting: General Surgery

## 2012-06-10 LAB — BASIC METABOLIC PANEL
Chloride: 98 mEq/L (ref 96–112)
Creatinine, Ser: 3.78 mg/dL — ABNORMAL HIGH (ref 0.50–1.35)
GFR calc Af Amer: 17 mL/min — ABNORMAL LOW (ref >90)
Potassium: 4.6 mEq/L (ref 3.5–5.1)
Sodium: 135 mEq/L (ref 135–145)

## 2012-06-10 LAB — CBC
HCT: 40 % (ref 39.0–52.0)
RBC: 4.24 MIL/uL (ref 4.22–5.81)
RDW: 14.2 % (ref 11.5–15.5)
WBC: 5.3 10*3/uL (ref 4.0–10.5)

## 2012-06-10 LAB — SURGICAL PCR SCREEN: Staphylococcus aureus: NEGATIVE

## 2012-06-10 NOTE — Pre-Procedure Instructions (Signed)
20 Jason [name]  06/10/2012   Your procedure is scheduled on:  Thursday June 17, 2012  Report to [facility] at 5:30 AM.  Call this number if you have problems the morning of surgery: [phone]   Remember:   Do not eat food or drink :After Midnight.    Take these medicines the morning of surgery with A SIP OF WATER: cipro, oxycodone,    Do not wear jewelry, make-up or nail polish.  Do not wear lotions, powders, or perfumes.   Do not shave 48 hours prior to surgery. Men may shave face and neck.  Do not bring valuables to the hospital.  Contacts, dentures or bridgework may not be worn into surgery.  Leave suitcase in the car. After surgery it may be brought to your room.  For patients admitted to the hospital, checkout time is 11:00 AM the day of discharge.   Patients discharged the day of surgery will not be allowed to drive home.  Name and phone number of your driver: family / friend  Special Instructions: Shower using CHG 2 nights before surgery and the night before surgery.  If you shower the day of surgery use CHG.  Use special wash - you have one bottle of CHG for all showers.  You should use approximately 1/3 of the bottle for each shower.   Please read over the following fact sheets that you were given: Pain Booklet, Coughing and Deep Breathing, MRSA Information and Surgical Site Infection Prevention

## 2012-06-10 NOTE — Progress Notes (Signed)
Forwarded to Anesthesia to review abnormal labs.

## 2012-06-11 NOTE — Consult Note (Addendum)
Anesthesia chart review: Patient is a 76 year old male scheduled for cholecystectomy on 06/17/2012 by Dr. Derrell Lolling. History includes chronic kidney disease (baseline Cr ~ 2.3), former smoker, CVA, hypertension, hypertriglyceridemia, umbilical hernia repair 05/21/11.  PCP is listed as Dr. Tracey Harries [phone]).  Chest x-ray on 06/10/2012 showed stable cardiopulmonary appearance with no new focal or acute abnormality seen.  EKG on 06/05/2012 showed normal sinus rhythm with PACs.  Echo on 03/08/10 showed: Left ventricle: The cavity size was normal. Wall thickness was normal. Systolic function was normal. The estimated ejection fraction was in the range of 55% to 60%. Trivial mitral regurgitation. Trivial tricuspid regurgitation.  Pre-operative labs noted.  BUN/Cr 68/3.78 (up from 37/2.30 on 06/05/12 and 39/2.25 on 05/19/11).  Patient was started on Cipro 500mg  Q day and Percocet PRN on 06/05/12, but otherwise no new medications.  His po intake has been down some with his gall bladder issues.  He denies diarrhea or vomiting.  He is scheduled to stop his ASA this weekend, but otherwise has not been taking any anti-inflammatories.  He is voiding without difficulty.  He is on lisinopril daily.  I forwarded labs to Dr. Everlene Other and spoke with him on the phone.  He is aware of up-coming surgery and recommends patient hold his lisinopril and push fluids at least up until surgery with plans to repeat BMET pre-operatively.  Patient notified of Dr. Bonney Leitz recommendations.  Dr. Derrell Lolling would like him to come in for repeat labs on 06/15/12.  Patient to arrive @ 0800. Will follow-up results when available.  Shonna Chock, PA-C   06/11/12 1554  Addendum: 06/15/12 1050 Reviewed and routed today's BMET to Dr. Derrell Lolling.  Patient's Cr is 2.49.  It's much closer to his baseline of 2.3, so I will not plan to repeat preoperatively.

## 2012-06-15 ENCOUNTER — Encounter (HOSPITAL_COMMUNITY)
Admission: RE | Admit: 2012-06-15 | Discharge: 2012-06-15 | Disposition: A | Discharge status: Home / Self Care | Payer: Medicare Other | Source: Ambulatory Visit | Attending: General Surgery | Admitting: General Surgery

## 2012-06-15 LAB — BASIC METABOLIC PANEL
CO2: 26 mEq/L (ref 19–32)
Calcium: 9.5 mg/dL (ref 8.4–10.5)
GFR calc non Af Amer: 24 mL/min — ABNORMAL LOW (ref >90)
Sodium: 137 mEq/L (ref 135–145)

## 2012-06-16 MED ORDER — CEFAZOLIN SODIUM-DEXTROSE 2-3 GM-% IV SOLR
2.0000 g | INTRAVENOUS | Status: AC
  Administered 2012-06-17: 2 g via INTRAVENOUS
  Filled 2012-06-16: qty 50

## 2012-06-17 ENCOUNTER — Encounter (HOSPITAL_COMMUNITY): Payer: Self-pay | Admitting: *Deleted

## 2012-06-17 ENCOUNTER — Encounter (HOSPITAL_COMMUNITY): Payer: Self-pay | Admitting: Vascular Surgery

## 2012-06-17 ENCOUNTER — Ambulatory Visit (HOSPITAL_COMMUNITY): Payer: Medicare Other | Admitting: Vascular Surgery

## 2012-06-17 ENCOUNTER — Encounter (HOSPITAL_COMMUNITY)
Admission: RE | Disposition: A | Discharge status: Home / Self Care | Payer: Self-pay | Source: Ambulatory Visit | Attending: General Surgery

## 2012-06-17 ENCOUNTER — Ambulatory Visit (HOSPITAL_COMMUNITY)
Admission: RE | Admit: 2012-06-17 | Discharge: 2012-06-17 | Disposition: A | Discharge status: Home / Self Care | Payer: Medicare Other | Source: Ambulatory Visit | Attending: General Surgery | Admitting: General Surgery

## 2012-06-17 DIAGNOSIS — Z96659 Presence of unspecified artificial knee joint: Secondary | ICD-10-CM | POA: Insufficient documentation

## 2012-06-17 DIAGNOSIS — Z8673 Personal history of transient ischemic attack (TIA), and cerebral infarction without residual deficits: Secondary | ICD-10-CM | POA: Insufficient documentation

## 2012-06-17 DIAGNOSIS — K8066 Calculus of gallbladder and bile duct with acute and chronic cholecystitis without obstruction: Secondary | ICD-10-CM

## 2012-06-17 DIAGNOSIS — G4700 Insomnia, unspecified: Secondary | ICD-10-CM | POA: Insufficient documentation

## 2012-06-17 DIAGNOSIS — K8000 Calculus of gallbladder with acute cholecystitis without obstruction: Secondary | ICD-10-CM | POA: Insufficient documentation

## 2012-06-17 DIAGNOSIS — Z87891 Personal history of nicotine dependence: Secondary | ICD-10-CM | POA: Insufficient documentation

## 2012-06-17 DIAGNOSIS — I129 Hypertensive chronic kidney disease with stage 1 through stage 4 chronic kidney disease, or unspecified chronic kidney disease: Secondary | ICD-10-CM | POA: Insufficient documentation

## 2012-06-17 DIAGNOSIS — K8010 Calculus of gallbladder with chronic cholecystitis without obstruction: Secondary | ICD-10-CM | POA: Insufficient documentation

## 2012-06-17 DIAGNOSIS — J309 Allergic rhinitis, unspecified: Secondary | ICD-10-CM | POA: Insufficient documentation

## 2012-06-17 DIAGNOSIS — K219 Gastro-esophageal reflux disease without esophagitis: Secondary | ICD-10-CM | POA: Insufficient documentation

## 2012-06-17 DIAGNOSIS — N189 Chronic kidney disease, unspecified: Secondary | ICD-10-CM | POA: Insufficient documentation

## 2012-06-17 HISTORY — PX: CHOLECYSTECTOMY: SHX55

## 2012-06-17 SURGERY — LAPAROSCOPIC CHOLECYSTECTOMY
Anesthesia: General | Site: Abdomen | Wound class: Clean Contaminated

## 2012-06-17 MED ORDER — MEPERIDINE HCL 25 MG/ML IJ SOLN: 6.2500 mg | INTRAMUSCULAR | Status: DC | PRN

## 2012-06-17 MED ORDER — BUPIVACAINE HCL 0.25 % IJ SOLN
INTRAMUSCULAR | Status: DC | PRN
  Administered 2012-06-17: 1 mL

## 2012-06-17 MED ORDER — ONDANSETRON HCL 4 MG/2ML IJ SOLN
INTRAMUSCULAR | Status: DC | PRN
  Administered 2012-06-17: 4 mg via INTRAVENOUS

## 2012-06-17 MED ORDER — OXYCODONE HCL 5 MG PO TABS
ORAL_TABLET | ORAL | Status: AC
  Filled 2012-06-17: qty 1

## 2012-06-17 MED ORDER — NEOSTIGMINE METHYLSULFATE 1 MG/ML IJ SOLN
INTRAMUSCULAR | Status: DC | PRN
  Administered 2012-06-17: 4 mg via INTRAVENOUS

## 2012-06-17 MED ORDER — MIDAZOLAM HCL 5 MG/5ML IJ SOLN
INTRAMUSCULAR | Status: DC | PRN
  Administered 2012-06-17: 1 mg via INTRAVENOUS

## 2012-06-17 MED ORDER — HEMOSTATIC AGENTS (NO CHARGE) OPTIME
TOPICAL | Status: DC | PRN
  Administered 2012-06-17: 1 via TOPICAL

## 2012-06-17 MED ORDER — OXYCODONE HCL 5 MG PO TABS
5.0000 mg | ORAL_TABLET | Freq: Once | ORAL | Status: AC | PRN
  Administered 2012-06-17: 5 mg via ORAL

## 2012-06-17 MED ORDER — HYDROMORPHONE HCL PF 1 MG/ML IJ SOLN
0.2500 mg | INTRAMUSCULAR | Status: DC | PRN
  Administered 2012-06-17: 0.5 mg via INTRAVENOUS

## 2012-06-17 MED ORDER — LIDOCAINE HCL (CARDIAC) 20 MG/ML IV SOLN
INTRAVENOUS | Status: DC | PRN
  Administered 2012-06-17: 50 mg via INTRAVENOUS

## 2012-06-17 MED ORDER — GLYCOPYRROLATE 0.2 MG/ML IJ SOLN
INTRAMUSCULAR | Status: DC | PRN
  Administered 2012-06-17: 0.4 mg via INTRAVENOUS

## 2012-06-17 MED ORDER — LIDOCAINE HCL 4 % MT SOLN
OROMUCOSAL | Status: DC | PRN
  Administered 2012-06-17: 4 mL via TOPICAL

## 2012-06-17 MED ORDER — 0.9 % SODIUM CHLORIDE (POUR BTL) OPTIME
TOPICAL | Status: DC | PRN
  Administered 2012-06-17: 1000 mL

## 2012-06-17 MED ORDER — ROCURONIUM BROMIDE 100 MG/10ML IV SOLN
INTRAVENOUS | Status: DC | PRN
  Administered 2012-06-17: 50 mg via INTRAVENOUS

## 2012-06-17 MED ORDER — IOHEXOL 300 MG/ML  SOLN
INTRAMUSCULAR | Status: DC | PRN
  Administered 2012-06-17: 1 mL via INTRAVENOUS

## 2012-06-17 MED ORDER — LACTATED RINGERS IV SOLN
INTRAVENOUS | Status: DC | PRN
  Administered 2012-06-17 (×2): via INTRAVENOUS

## 2012-06-17 MED ORDER — HYDROMORPHONE HCL PF 1 MG/ML IJ SOLN
INTRAMUSCULAR | Status: AC
  Filled 2012-06-17: qty 1

## 2012-06-17 MED ORDER — ONDANSETRON HCL 4 MG/2ML IJ SOLN
4.0000 mg | Freq: Once | INTRAMUSCULAR | Status: AC | PRN
  Administered 2012-06-17: 4 mg via INTRAVENOUS

## 2012-06-17 MED ORDER — ONDANSETRON HCL 4 MG/2ML IJ SOLN
INTRAMUSCULAR | Status: AC
  Filled 2012-06-17: qty 2

## 2012-06-17 MED ORDER — SODIUM CHLORIDE 0.9 % IR SOLN
Status: DC | PRN
  Administered 2012-06-17 (×2): 1

## 2012-06-17 MED ORDER — FENTANYL CITRATE 0.05 MG/ML IJ SOLN
INTRAMUSCULAR | Status: DC | PRN
  Administered 2012-06-17 (×8): 50 ug via INTRAVENOUS

## 2012-06-17 MED ORDER — PROPOFOL 10 MG/ML IV BOLUS
INTRAVENOUS | Status: DC | PRN
  Administered 2012-06-17: 140 mg via INTRAVENOUS
  Administered 2012-06-17: 20 mg via INTRAVENOUS

## 2012-06-17 MED ORDER — BUPIVACAINE HCL (PF) 0.25 % IJ SOLN
INTRAMUSCULAR | Status: AC
  Filled 2012-06-17: qty 30

## 2012-06-17 MED ORDER — OXYCODONE HCL 5 MG/5ML PO SOLN: 5.0000 mg | Freq: Once | ORAL | Status: AC | PRN

## 2012-06-17 SURGICAL SUPPLY — 51 items
APL SKNCLS STERI-STRIP NONHPOA (GAUZE/BANDAGES/DRESSINGS) ×2
APPLIER CLIP 5 13 M/L LIGAMAX5 (MISCELLANEOUS) ×3
APR CLP MED LRG 5 ANG JAW (MISCELLANEOUS) ×2
BAG SPEC RTRVL LRG 6X4 10 (ENDOMECHANICALS)
BENZOIN TINCTURE PRP APPL 2/3 (GAUZE/BANDAGES/DRESSINGS) ×3 IMPLANT
CANISTER SUCTION 2500CC (MISCELLANEOUS) ×3 IMPLANT
CHLORAPREP W/TINT 26ML (MISCELLANEOUS) ×3 IMPLANT
CLIP APPLIE 5 13 M/L LIGAMAX5 (MISCELLANEOUS) ×2 IMPLANT
CLOTH BEACON ORANGE TIMEOUT ST (SAFETY) ×3 IMPLANT
COVER MAYO STAND STRL (DRAPES) ×3 IMPLANT
COVER SURGICAL LIGHT HANDLE (MISCELLANEOUS) ×3 IMPLANT
COVER TRANSDUCER ULTRASND (DRAPES) ×2 IMPLANT
DEVICE TROCAR PUNCTURE CLOSURE (ENDOMECHANICALS) ×3 IMPLANT
DRAPE C-ARM 42X72 X-RAY (DRAPES) ×3 IMPLANT
DRAPE UTILITY 15X26 W/TAPE STR (DRAPE) ×6 IMPLANT
ELECT PAD DSPR THERM+ ADLT (MISCELLANEOUS) ×2 IMPLANT
ELECT REM PT RETURN 9FT ADLT (ELECTROSURGICAL) ×3
ELECTRODE REM PT RTRN 9FT ADLT (ELECTROSURGICAL) ×2 IMPLANT
GAUZE SPONGE 2X2 8PLY STRL LF (GAUZE/BANDAGES/DRESSINGS) ×2 IMPLANT
GLOVE BIO SURGEON STRL SZ7 (GLOVE) ×2 IMPLANT
GLOVE BIO SURGEON STRL SZ7.5 (GLOVE) ×5 IMPLANT
GLOVE BIOGEL PI IND STRL 6.5 (GLOVE) ×1 IMPLANT
GLOVE BIOGEL PI IND STRL 7.0 (GLOVE) ×1 IMPLANT
GLOVE BIOGEL PI IND STRL 7.5 (GLOVE) ×1 IMPLANT
GLOVE BIOGEL PI INDICATOR 6.5 (GLOVE) ×1
GLOVE BIOGEL PI INDICATOR 7.0 (GLOVE) ×1
GLOVE BIOGEL PI INDICATOR 7.5 (GLOVE) ×1
GOWN STRL NON-REIN LRG LVL3 (GOWN DISPOSABLE) ×7 IMPLANT
GOWN STRL REIN XL XLG (GOWN DISPOSABLE) ×3 IMPLANT
HEMOSTAT SNOW SURGICEL 2X4 (HEMOSTASIS) ×2 IMPLANT
IV CATH 14GX2 1/4 (CATHETERS) ×3 IMPLANT
KIT BASIN OR (CUSTOM PROCEDURE TRAY) ×3 IMPLANT
KIT ROOM TURNOVER OR (KITS) ×3 IMPLANT
NDL INSUFFLATION 14GA 120MM (NEEDLE) ×1 IMPLANT
NEEDLE INSUFFLATION 14GA 120MM (NEEDLE) ×3 IMPLANT
NS IRRIG 1000ML POUR BTL (IV SOLUTION) ×3 IMPLANT
PAD ARMBOARD 7.5X6 YLW CONV (MISCELLANEOUS) ×6 IMPLANT
POUCH SPECIMEN RETRIEVAL 10MM (ENDOMECHANICALS) IMPLANT
PROBE LAPAROSCOPIC 5MM W/FTSWT (MISCELLANEOUS) ×2 IMPLANT
SCISSORS LAP 5X35 DISP (ENDOMECHANICALS) ×3 IMPLANT
SET CHOLANGIOGRAPHY FRANKLIN (SET/KITS/TRAYS/PACK) ×3 IMPLANT
SET IRRIG TUBING LAPAROSCOPIC (IRRIGATION / IRRIGATOR) ×3 IMPLANT
SLEEVE ENDOPATH XCEL 5M (ENDOMECHANICALS) ×6 IMPLANT
SPECIMEN JAR SMALL (MISCELLANEOUS) ×3 IMPLANT
SPONGE GAUZE 2X2 STER 10/PKG (GAUZE/BANDAGES/DRESSINGS) ×1
SUT MNCRL AB 3-0 PS2 18 (SUTURE) ×3 IMPLANT
TOWEL OR 17X24 6PK STRL BLUE (TOWEL DISPOSABLE) ×3 IMPLANT
TOWEL OR 17X26 10 PK STRL BLUE (TOWEL DISPOSABLE) ×3 IMPLANT
TRAY LAPAROSCOPIC (CUSTOM PROCEDURE TRAY) ×3 IMPLANT
TROCAR XCEL NON-BLD 11X100MML (ENDOMECHANICALS) ×3 IMPLANT
TROCAR XCEL NON-BLD 5MMX100MML (ENDOMECHANICALS) ×3 IMPLANT

## 2012-06-17 NOTE — Anesthesia Procedure Notes (Signed)
Procedure Name: Intubation Date/Time: 06/17/2012 7:22 AM Performed by: Ellin Goodie Pre-anesthesia Checklist: Patient identified, Emergency Drugs available, Suction available, Patient being monitored and Timeout performed Patient Re-evaluated:Patient Re-evaluated prior to inductionOxygen Delivery Method: Circle system utilized Preoxygenation: Pre-oxygenation with 100% oxygen Intubation Type: IV induction Ventilation: Mask ventilation without difficulty Laryngoscope Size: Mac and 3 Grade View: Grade II Tube type: Oral Tube size: 7.5 mm Number of attempts: 1 Airway Equipment and Method: Stylet Placement Confirmation: ETT inserted through vocal cords under direct vision,  positive ETCO2 and breath sounds checked- equal and bilateral Secured at: 23 cm Tube secured with: Tape Dental Injury: Teeth and Oropharynx as per pre-operative assessment

## 2012-06-17 NOTE — Interval H&P Note (Signed)
History and Physical Interval Note:  06/17/2012 7:13 AM  Jason [name]  has presented today for surgery, with the diagnosis of gallstones  The various methods of treatment have been discussed with the patient and family. After consideration of risks, benefits and other options for treatment, the patient has consented to  Procedure(s) (LRB) with comments: LAPAROSCOPIC CHOLECYSTECTOMY WITH INTRAOPERATIVE CHOLANGIOGRAM (N/A) as a surgical intervention .  The patient's history has been reviewed, patient examined, no change in status, stable for surgery.  I have reviewed the patient's chart and labs.  Questions were answered to the patient's satisfaction.     Marigene Ehlers., Jed Limerick

## 2012-06-17 NOTE — H&P (View-Only) (Signed)
Patient ID: Jason [name], male   DOB: 04/29/1936, 76 y.o.   MRN: [id number]  Chief Complaint  Patient presents with  . Abdominal Pain    HPI Dvonte D [name] is a 76 y.o. male referred by Dr. Wickline for evaluation of gallstones.  The patient states the right upper quadrant epigastric pain started approximately Thursday of last week. Patient noted that his physician and was seen is on antibiotics. Patient underwent CT scan at urgent care Center which revealed gallstones that were large and the gallbladder was thickened gallbladder wall. Patient's labs were within normal limits. Patient since that time has had no abdominal pain but states his pain medication. HPI  Past Medical History  Diagnosis Date  . Allergic rhinitis, cause unspecified   . Headache   . Pure hyperglyceridemia   . Unspecified cerebral artery occlusion with cerebral infarction   . Unspecified transient cerebral ischemia 02/2010  . Chronic kidney disease   . Organic insomnia, unspecified   . GERD (gastroesophageal reflux disease)   . Stroke     left basal ganglia infarction 9/11- LOV DR Sethi 7/12 on chart  . Hypertension     stress test 5 years ago, eccho 9/11 on chart  . Recurrent upper respiratory infection (URI)     states chest congestion x 3-4 weeks, no fever- states was productive small amt yellow phlegm, now is clear    Past Surgical History  Procedure Date  . Knee surgery     x2  . Joint replacement     right total knee replacement x 2  . Tonsillectomy   . Umbilical hernia repair 05/21/2011    Procedure: HERNIA REPAIR UMBILICAL ADULT;  Surgeon: Douglas A Blackman, MD;  Location: [facility];  Service: General;  Laterality: N/A;    No family history on file.  Social History History  Substance Use Topics  . Smoking status: Former Smoker -- 2.0 packs/day for 10 years  . Smokeless tobacco: Former User    Quit date: 05/18/1989  . Alcohol Use: 3.6 oz/week    3 Glasses of wine, 3 Shots of liquor per  week    No Known Allergies  Current Outpatient Prescriptions  Medication Sig Dispense Refill  . aspirin 325 MG EC tablet Take 325 mg by mouth every morning.       . Azelastine HCl (ASTEPRO) 0.15 % SOLN Place 2 sprays into the nose daily as needed. Allergies       . ciprofloxacin (CIPRO) 500 MG tablet Take 1 tablet (500 mg total) by mouth daily.  7 tablet  0  . ezetimibe-simvastatin (VYTORIN) 10-10 MG per tablet Take 1 tablet by mouth every morning.       . fenofibrate (TRICOR) 48 MG tablet Take 48 mg by mouth every morning.       . lisinopril (PRINIVIL,ZESTRIL) 10 MG tablet Take 10 mg by mouth every morning.       . oxyCODONE-acetaminophen (PERCOCET/ROXICET) 5-325 MG per tablet Take 1 tablet by mouth every 4 (four) hours as needed for pain.  15 tablet  0  . zolpidem (AMBIEN) 5 MG tablet Take 10 mg by mouth at bedtime as needed. Sleep         Review of Systems Review of Systems  Constitutional: Negative.   HENT: Negative.   Eyes: Negative.   Cardiovascular: Negative.   Gastrointestinal: Positive for abdominal pain.  Musculoskeletal: Negative.   Neurological: Negative.     Blood pressure 100/60, pulse 84, resp. rate 24, height 6'   1" (1.854 m), weight 208 lb (94.348 kg).  Physical Exam Physical Exam  Constitutional: He is oriented to person, place, and time. He appears well-developed and well-nourished.  HENT:  Head: Normocephalic and atraumatic.  Eyes: Conjunctivae normal and EOM are normal. Pupils are equal, round, and reactive to light.  Neck: Normal range of motion. Neck supple.  Cardiovascular: Normal rate and normal heart sounds.   Pulmonary/Chest: Effort normal.  Abdominal: Soft. Bowel sounds are normal. He exhibits no mass. There is tenderness (ruq). There is no rebound and no guarding.  Musculoskeletal: Normal range of motion.  Neurological: He is alert and oriented to person, place, and time.    Data Reviewed Ultrasound due revealed large gallstones with  gallbladder thickened gallbladder wall. LFTs within normal limits.  Assessment    Patient is a 76-year-old male with symptomatic cholelithiasis.    Plan    1. We'll proceed operating room for laparoscopic cholecystectomy with intraoperative cholangiogram.  2.All risks and benefits were discussed with the patient, to generally include infection, bleeding, damage to surrounding structures, and recurrence. Alternatives were offered and described.  All questions were answered and the patient voiced understanding of the procedure and wishes to proceed at this point.        Talya Quain Jr., Adrienne Trombetta 06/08/2012, 1:45 PM    

## 2012-06-17 NOTE — Transfer of Care (Signed)
Immediate Anesthesia Transfer of Care Note  Patient: Jason [name]  Procedure(s) Performed: Procedure(s) (LRB) with comments: LAPAROSCOPIC CHOLECYSTECTOMY (N/A)  Patient Location: PACU  Anesthesia Type:General  Level of Consciousness: awake, alert  and oriented  Airway & Oxygen Therapy: Patient Spontanous Breathing  Post-op Assessment: Report given to PACU RN  Post vital signs: stable  Complications: No apparent anesthesia complications

## 2012-06-17 NOTE — Op Note (Signed)
Pre Operative Diagnosis: Sx gallstones   Post Operative Diagnosis: sames  Surgeon: Dr. Axel Filler   Procedure: Lap chole  Assistant: None  Anesthesia: Gen. Endotracheal anesthesia   EBL: 20cc  Complications:  Counts: reported as correct x 2   Findings: The patient had large gallstone within gallbladder  Indications for procedure: the pt was a 76 y/o M with sx gallstones that seen in ED and was referred for removal.  Details of the procedure:  The patient was taken to the operating and placed in the supine position with bilateral SCDs in place. A time out was called and all facts were verified. A pneumoperitoneum was obtained via A Veress needle technique to a pressure of 14mm of mercury. A 5mm trochar was then placed in the right upper quadrant under visualization, and there were no injuries to any abdominal organs. A 11 mm port was then placed in the umbilical region after infiltrating with local anesthesia under direct visualization. A second and third epigastric port and right lower quadrant port placement under direct visualization, respectively. The gallbladder was identified and retracted, the peritoneum was then sharply dissected from the gallbladder and this dissection was carried down to Calot's triangle. The gallbladder was identified and stripped away circumferentially and seen going into the gallbladder 360. 2 clips were placed proximally one distally and the cystic duct transected. The cystic artery was identified and 2 clips placed proximally and one distally and transected.  We then proceeded to remove the gallbladder off the hepatic fossa with Bovie cautery.  Due to the inflammation of the gallbladder on the liver bed an Argon beam coagulator was used to coagulate the liver bed. An latex retrieval bag was then placed in the abdomen and gallbladder placed in the bag. The hepatic fossa was then reexamined and hemostasis was achieved with Bovie cautery and was excellent at  the end of the case. The subhepatic fossa and perihepatic fossa was then irrigated until the effluent was clear. The 11 mm trocar fascia was reapproximated with the Endo Close #1 Vicryl x4 in an interrputed fashion.  The pneumoperitoneum was evacuated and all trochars removed under direct visulalization.  The skin was then closed with 4-0 Monocryl and the skin dressed with Steri-Strips, gauze, and tape.  The patient was awaken from general anesthesia and taken to the recovery room in stable condition.

## 2012-06-17 NOTE — Preoperative (Signed)
Beta Blockers   Reason not to administer Beta Blockers:Not Applicable 

## 2012-06-17 NOTE — Anesthesia Postprocedure Evaluation (Signed)
Anesthesia Post Note  Patient: Jason [name]  Procedure(s) Performed: Procedure(s) (LRB): LAPAROSCOPIC CHOLECYSTECTOMY (N/A)  Anesthesia type: general  Patient location: PACU  Post pain: Pain level controlled  Post assessment: Patient's Cardiovascular Status Stable  Last Vitals:  Filed Vitals:   06/17/12 0930  BP: 152/87  Pulse: 70  Temp:   Resp: 9    Post vital signs: Reviewed and stable  Level of consciousness: sedated  Complications: No apparent anesthesia complications

## 2012-06-17 NOTE — Anesthesia Preprocedure Evaluation (Addendum)
Anesthesia Evaluation  Patient identified by MRN, date of birth, ID band Patient awake    Reviewed: Allergy & Precautions, H&P , NPO status , Patient's Chart, lab work & pertinent test results  Airway Mallampati: II TM Distance: >3 FB Neck ROM: Full    Dental  (+) Teeth Intact and Dental Advisory Given   Pulmonary Recent URI , Resolved,  breath sounds clear to auscultation        Cardiovascular hypertension, Pt. on medications + Peripheral Vascular Disease Rhythm:Regular Rate:Normal     Neuro/Psych  Headaches, CVA, No Residual Symptoms    GI/Hepatic GERD-  Medicated,  Endo/Other    Renal/GU CRFRenal disease     Musculoskeletal   Abdominal (+)  Abdomen: soft. Bowel sounds: normal.  Peds  Hematology   Anesthesia Other Findings   Reproductive/Obstetrics                         Anesthesia Physical Anesthesia Plan  ASA: III  Anesthesia Plan: General   Post-op Pain Management:    Induction: Intravenous  Airway Management Planned: Oral ETT  Additional Equipment:   Intra-op Plan:   Post-operative Plan: Extubation in OR  Informed Consent: I have reviewed the patients History and Physical, chart, labs and discussed the procedure including the risks, benefits and alternatives for the proposed anesthesia with the patient or authorized representative who has indicated his/her understanding and acceptance.     Plan Discussed with: CRNA and Surgeon  Anesthesia Plan Comments:         Anesthesia Quick Evaluation

## 2012-06-21 ENCOUNTER — Encounter (HOSPITAL_COMMUNITY): Payer: Self-pay | Admitting: General Surgery

## 2012-07-01 ENCOUNTER — Encounter (INDEPENDENT_AMBULATORY_CARE_PROVIDER_SITE_OTHER): Payer: Self-pay | Admitting: General Surgery

## 2012-07-01 ENCOUNTER — Ambulatory Visit (INDEPENDENT_AMBULATORY_CARE_PROVIDER_SITE_OTHER): Payer: Medicare Other | Admitting: General Surgery

## 2012-07-01 VITALS — BP 130/72 | HR 58 | Temp 97.6°F | Resp 18 | Ht 73.0 in | Wt 214.8 lb

## 2012-07-01 DIAGNOSIS — Z9889 Other specified postprocedural states: Secondary | ICD-10-CM

## 2012-07-01 DIAGNOSIS — Z9049 Acquired absence of other specified parts of digestive tract: Secondary | ICD-10-CM

## 2012-07-01 NOTE — Progress Notes (Signed)
Patient ID: Jason [name], male   DOB: 19-Nov-1935, 77 y.o.   MRN: [id number] The patient is a 77 year old male status post laparoscope cholecystectomy. Patient did well postoperatively without any complaints. He had a normal appetite and normal bowel function.  On exam:  Wounds are clean dry and intact  Assessment and plan:  1. Patient follow up when necessary  2. Patient to continue with weight restrictions for another month.

## 2012-07-10 ENCOUNTER — Emergency Department (HOSPITAL_BASED_OUTPATIENT_CLINIC_OR_DEPARTMENT_OTHER): Payer: Medicare Other

## 2012-07-10 ENCOUNTER — Encounter (HOSPITAL_BASED_OUTPATIENT_CLINIC_OR_DEPARTMENT_OTHER): Payer: Self-pay | Admitting: *Deleted

## 2012-07-10 ENCOUNTER — Inpatient Hospital Stay (HOSPITAL_BASED_OUTPATIENT_CLINIC_OR_DEPARTMENT_OTHER)
Admission: EM | Admit: 2012-07-10 | Discharge: 2012-07-12 | DRG: 066 | Disposition: A | Discharge status: Home / Self Care | Payer: Medicare Other | Attending: Internal Medicine | Admitting: Internal Medicine

## 2012-07-10 DIAGNOSIS — I6330 Cerebral infarction due to thrombosis of unspecified cerebral artery: Principal | ICD-10-CM | POA: Diagnosis present

## 2012-07-10 DIAGNOSIS — Z7902 Long term (current) use of antithrombotics/antiplatelets: Secondary | ICD-10-CM

## 2012-07-10 DIAGNOSIS — N189 Chronic kidney disease, unspecified: Secondary | ICD-10-CM | POA: Diagnosis present

## 2012-07-10 DIAGNOSIS — E785 Hyperlipidemia, unspecified: Secondary | ICD-10-CM | POA: Diagnosis present

## 2012-07-10 DIAGNOSIS — R29898 Other symptoms and signs involving the musculoskeletal system: Secondary | ICD-10-CM | POA: Diagnosis present

## 2012-07-10 DIAGNOSIS — I10 Essential (primary) hypertension: Secondary | ICD-10-CM

## 2012-07-10 DIAGNOSIS — Z96659 Presence of unspecified artificial knee joint: Secondary | ICD-10-CM

## 2012-07-10 DIAGNOSIS — Z79899 Other long term (current) drug therapy: Secondary | ICD-10-CM

## 2012-07-10 DIAGNOSIS — I639 Cerebral infarction, unspecified: Secondary | ICD-10-CM | POA: Diagnosis present

## 2012-07-10 DIAGNOSIS — I6350 Cerebral infarction due to unspecified occlusion or stenosis of unspecified cerebral artery: Secondary | ICD-10-CM

## 2012-07-10 DIAGNOSIS — I129 Hypertensive chronic kidney disease with stage 1 through stage 4 chronic kidney disease, or unspecified chronic kidney disease: Secondary | ICD-10-CM | POA: Diagnosis present

## 2012-07-10 DIAGNOSIS — E7800 Pure hypercholesterolemia, unspecified: Secondary | ICD-10-CM | POA: Diagnosis present

## 2012-07-10 DIAGNOSIS — Z8673 Personal history of transient ischemic attack (TIA), and cerebral infarction without residual deficits: Secondary | ICD-10-CM

## 2012-07-10 DIAGNOSIS — Z87891 Personal history of nicotine dependence: Secondary | ICD-10-CM

## 2012-07-10 DIAGNOSIS — K219 Gastro-esophageal reflux disease without esophagitis: Secondary | ICD-10-CM | POA: Diagnosis present

## 2012-07-10 LAB — COMPREHENSIVE METABOLIC PANEL
Albumin: 3.7 g/dL (ref 3.5–5.2)
Alkaline Phosphatase: 35 U/L — ABNORMAL LOW (ref 39–117)
BUN: 47 mg/dL — ABNORMAL HIGH (ref 6–23)
CO2: 25 mEq/L (ref 19–32)
Chloride: 104 mEq/L (ref 96–112)
GFR calc non Af Amer: 25 mL/min — ABNORMAL LOW (ref >90)
Potassium: 4.6 mEq/L (ref 3.5–5.1)
Total Bilirubin: 0.2 mg/dL — ABNORMAL LOW (ref 0.3–1.2)

## 2012-07-10 LAB — DIFFERENTIAL
Basophils Relative: 1 % (ref 0–1)
Lymphocytes Relative: 28 % (ref 12–46)
Lymphs Abs: 1.5 10*3/uL (ref 0.7–4.0)
Monocytes Absolute: 0.6 10*3/uL (ref 0.1–1.0)
Monocytes Relative: 11 % (ref 3–12)
Neutro Abs: 3 10*3/uL (ref 1.7–7.7)
Neutrophils Relative %: 57 % (ref 43–77)

## 2012-07-10 LAB — PROTIME-INR: INR: 1 (ref 0.00–1.49)

## 2012-07-10 LAB — URINALYSIS, ROUTINE W REFLEX MICROSCOPIC
Glucose, UA: NEGATIVE mg/dL
Hgb urine dipstick: NEGATIVE
Ketones, ur: NEGATIVE mg/dL
Protein, ur: NEGATIVE mg/dL
Urobilinogen, UA: 0.2 mg/dL (ref 0.0–1.0)

## 2012-07-10 LAB — APTT: aPTT: 32 seconds (ref 24–37)

## 2012-07-10 LAB — CBC
HCT: 35.4 % — ABNORMAL LOW (ref 39.0–52.0)
Hemoglobin: 11.2 g/dL — ABNORMAL LOW (ref 13.0–17.0)
MCHC: 31.6 g/dL (ref 30.0–36.0)
RBC: 3.67 MIL/uL — ABNORMAL LOW (ref 4.22–5.81)
WBC: 5.3 10*3/uL (ref 4.0–10.5)

## 2012-07-10 MED ORDER — ASPIRIN-DIPYRIDAMOLE ER 25-200 MG PO CP12
1.0000 | ORAL_CAPSULE | Freq: Two times a day (BID) | ORAL | Status: DC
  Administered 2012-07-11 – 2012-07-12 (×4): 1 via ORAL
  Filled 2012-07-10 (×5): qty 1

## 2012-07-10 MED ORDER — ENOXAPARIN SODIUM 30 MG/0.3ML ~~LOC~~ SOLN
30.0000 mg | SUBCUTANEOUS | Status: DC
  Administered 2012-07-11 (×2): 30 mg via SUBCUTANEOUS
  Filled 2012-07-10 (×3): qty 0.3

## 2012-07-10 MED ORDER — LISINOPRIL 10 MG PO TABS
10.0000 mg | ORAL_TABLET | ORAL | Status: DC
  Administered 2012-07-11 – 2012-07-12 (×2): 10 mg via ORAL
  Filled 2012-07-10 (×3): qty 1

## 2012-07-10 MED ORDER — EZETIMIBE-SIMVASTATIN 10-10 MG PO TABS
1.0000 | ORAL_TABLET | ORAL | Status: DC
  Filled 2012-07-10 (×2): qty 1

## 2012-07-10 NOTE — ED Notes (Signed)
Called EMS to transport pt to [facility].

## 2012-07-10 NOTE — Progress Notes (Signed)
Called at 18:55PM from [facility] - by Dr. Lynelle Doctor to transfer patient for stroke. The patient woke up with a stroke this AM , so that is at least 12 hours prior to the call.  Patient to be transferred for further work up  [employer]

## 2012-07-10 NOTE — ED Notes (Signed)
Pt stats he has a hx of a "mini CVA in 2011", noticed left hand weakness at 0700 tghis a.m. Denies other s/s. No other neuro deficits noted. PERL.

## 2012-07-10 NOTE — ED Notes (Signed)
Transferred at this time to [facility] hospital via [facility]

## 2012-07-10 NOTE — ED Provider Notes (Signed)
History    CSN: [id number] Arrival date & time 07/10/12  1556 First MD Initiated Contact with Patient 07/10/12 1615     Chief Complaint  Patient presents with  . Extremity Weakness   HPI Pt woke up with weakness as well as numbness in his left hand.  Last night he felt fine.  The fingers are not moving the way they normally do.  He feels like his grip is effected.  No trouble with speech, balance or coordination.  No numbness or weakness elsewhere.  Pt has history of TIA and stroke.  He is concerned that these symptoms are similar although at that time his speech was the main issue. Past Medical History  Diagnosis Date  . Allergic rhinitis, cause unspecified   . Headache   . Pure hyperglyceridemia   . Unspecified transient cerebral ischemia 02/2010  . Chronic kidney disease   . Organic insomnia, unspecified   . GERD (gastroesophageal reflux disease)   . Recurrent upper respiratory infection (URI)     states chest congestion x 3-4 weeks, no fever- states was productive small amt yellow phlegm, now is clear  . Hypertension     stress test 5 years ago, eccho 9/11 on chart, Dr. Brown Human  . Stroke     left basal ganglia infarction 9/11- LOV DR Pearlean Brownie 7/12 on chart  . Unspecified cerebral artery occlusion with cerebral infarction     Past Surgical History  Procedure Date  . Knee surgery     x2  . Joint replacement     right total knee replacement x 2  . Tonsillectomy   . Umbilical hernia repair 05/21/2011    Procedure: HERNIA REPAIR UMBILICAL ADULT;  Surgeon: Shelly Rubenstein, MD;  Location: [facility] ORS;  Service: General;  Laterality: N/A;  . Cholecystectomy 06/17/2012    Procedure: LAPAROSCOPIC CHOLECYSTECTOMY;  Surgeon: Axel Filler, MD;  Location: [facility] OR;  Service: General;  Laterality: N/A;    History reviewed. No pertinent family history.  History  Substance Use Topics  . Smoking status: Former Smoker -- 2.0 packs/day for 10 years  . Smokeless tobacco: Former Neurosurgeon    Quit  date: 06/23/1973  . Alcohol Use: 3.6 oz/week    3 Glasses of wine, 3 Shots of liquor per week      Review of Systems  All other systems reviewed and are negative.    Allergies  Review of patient's allergies indicates no known allergies.  Home Medications   Current Outpatient Rx  Name  Route  Sig  Dispense  Refill  . ASPIRIN 325 MG PO TBEC   Oral   Take 325 mg by mouth every morning.          Marland Kitchen EZETIMIBE-SIMVASTATIN 10-10 MG PO TABS   Oral   Take 1 tablet by mouth every morning.          . FENOFIBRATE 48 MG PO TABS   Oral   Take 48 mg by mouth every morning.          . IBUPROFEN 800 MG PO TABS               . LISINOPRIL 10 MG PO TABS   Oral   Take 10 mg by mouth every morning.          Marland Kitchen ZOLPIDEM TARTRATE 5 MG PO TABS   Oral   Take 10 mg by mouth at bedtime as needed. Sleep            BP  130/75  Pulse 62  Temp 97.5 F (36.4 C) (Oral)  Resp 20  Ht 6\' 1"  (1.854 m)  Wt 205 lb (92.987 kg)  BMI 27.05 kg/m2  SpO2 100%  Physical Exam  Nursing note and vitals reviewed. Constitutional: He is oriented to person, place, and time. He appears well-developed and well-nourished. No distress.  HENT:  Head: Normocephalic and atraumatic.  Right Ear: External ear normal.  Left Ear: External ear normal.  Mouth/Throat: Oropharynx is clear and moist.  Eyes: Conjunctivae normal are normal. Right eye exhibits no discharge. Left eye exhibits no discharge. No scleral icterus.  Neck: Neck supple. No tracheal deviation present.  Cardiovascular: Normal rate, regular rhythm and intact distal pulses.   Pulmonary/Chest: Effort normal and breath sounds normal. No stridor. No respiratory distress. He has no wheezes. He has no rales.  Abdominal: Soft. Bowel sounds are normal. He exhibits no distension. There is no tenderness. There is no rebound and no guarding.  Musculoskeletal: He exhibits no edema and no tenderness.  Neurological: He is alert and oriented to person,  place, and time. No cranial nerve deficit ( no gross defecits noted) or sensory deficit. He exhibits normal muscle tone. He displays no seizure activity. Coordination normal.       No pronator drift bilateral upper extrem, able to hold both legs off bed for 5 seconds, sensation intact in all extremities, weak grip strength left hand, no visual field cuts, no left or right sided neglect  Skin: Skin is warm and dry. No rash noted.  Psychiatric: He has a normal mood and affect.    ED Course  Procedures (including critical care time)  Rate: 64  Rhythm: normal sinus rhythm  QRS Axis: normal  Intervals: normal  ST/T Wave abnormalities: normal  Conduction Disutrbances:none  Narrative Interpretation:nl   Old EKG Reviewed: none available Labs Reviewed  CBC - Abnormal; Notable for the following:    RBC 3.67 (*)     Hemoglobin 11.2 (*)     HCT 35.4 (*)     All other components within normal limits  COMPREHENSIVE METABOLIC PANEL - Abnormal; Notable for the following:    Glucose, Bld 104 (*)     BUN 47 (*)     Creatinine, Ser 2.40 (*)     Alkaline Phosphatase 35 (*)     Total Bilirubin 0.2 (*)     GFR calc non Af Amer 25 (*)     GFR calc Af Amer 29 (*)     All other components within normal limits  PROTIME-INR  APTT  DIFFERENTIAL  URINALYSIS, ROUTINE W REFLEX MICROSCOPIC  GLUCOSE, CAPILLARY  TROPONIN I   Mr Brain Wo Contrast  07/10/2012  *RADIOLOGY REPORT*  Clinical Data: Left hand weakness beginning this morning.  History of hyperlipidemia.  History of prior smoking.  History of hypertension.  History of prior cerebral infarction.  MRI HEAD WITHOUT CONTRAST  Technique:  Multiplanar, multiecho pulse sequences of the brain and surrounding structures were obtained according to standard protocol without intravenous contrast.  Comparison: 03/07/2010.  Findings: There is a 5 x 10 mm acute right posterior frontal cortical and subcortical infarction without hemorrhage or mass effect.  This is on  the opposite side of the previous left hemisphere acute infarction demonstrated in 2011.  There is no hydrocephalus or extra-axial fluid.  There is moderately advanced atrophy for the patient's chronologic age of 67.  There is extensive chronic microvascular ischemic change in the periventricular and subcortical white matter, likely secondary  hypertension.  No foci of chronic hemorrhage are definitely seen.  Numerous remote lacunar infarcts affect the cerebral hemispheres most notable in the left periventricular region.  The pituitary and cerebellar tonsils are unremarkable.  There are no worrisome osseous lesions.  Flow voids are maintained in the carotid, basilar, and vertebral arteries suggesting patency.  There are no acute orbital, sinus, or mastoid findings.  Compared with priors, there is slight progression of atrophy and chronic microvascular ischemic change.  IMPRESSION: Acute 5 x 10 mm right posterior frontal cortical and subcortical infarction without hemorrhage or mass effect.  Advanced atrophy and chronic microvascular ischemic change, with slight progression from 2011.   Original Report Authenticated By: Davonna Belling, M.D.      1. Stroke       MDM  The patient's MRI shows an acute stroke in the right posterior frontal region which would coincide with the symptoms of his left upper extremity.  Patient woke up with the symptoms this morning. He is not a TPA candidate due to the duration and uncertain onset.  I will consult with the medical service for admission and further treatment        Celene Kras, MD 07/10/12 [phone]

## 2012-07-10 NOTE — H&P (Signed)
PCP:   Aura Dials, MD   Chief Complaint:  Left hand numbness  HPI: 77 yo male h/o tia, htn, hld with left hand numbness since early this am.  Waited several hours and the numbness persisted so went to urgent care at around 3 pm and found to have acute infarct on mri.  No slurred speech.  No weakness in hand.  No issues in ble.  No n/t in right hand or arm.  No fevers.  No recetn illnesses.  Takes asa 324mg  daily already.  No headache.  Still with numbness in all fingers left.  Review of Systems:  O/ w neg  Past Medical History: Past Medical History  Diagnosis Date  . Allergic rhinitis, cause unspecified   . Headache   . Pure hyperglyceridemia   . Unspecified transient cerebral ischemia 02/2010  . Chronic kidney disease   . Organic insomnia, unspecified   . GERD (gastroesophageal reflux disease)   . Recurrent upper respiratory infection (URI)     states chest congestion x 3-4 weeks, no fever- states was productive small amt yellow phlegm, now is clear  . Hypertension     stress test 5 years ago, eccho 9/11 on chart, Dr. Brown Human  . Stroke     left basal ganglia infarction 9/11- LOV DR Pearlean Brownie 7/12 on chart  . Unspecified cerebral artery occlusion with cerebral infarction    Past Surgical History  Procedure Date  . Knee surgery     x2  . Joint replacement     right total knee replacement x 2  . Tonsillectomy   . Umbilical hernia repair 05/21/2011    Procedure: HERNIA REPAIR UMBILICAL ADULT;  Surgeon: Shelly Rubenstein, MD;  Location: [facility];  Service: General;  Laterality: N/A;  . Cholecystectomy 06/17/2012    Procedure: LAPAROSCOPIC CHOLECYSTECTOMY;  Surgeon: Axel Filler, MD;  Location: [facility] OR;  Service: General;  Laterality: N/A;    Medications: Prior to Admission medications   Medication Sig Start Date End Date Taking? Authorizing Provider  aspirin 325 MG EC tablet Take 325 mg by mouth every morning.    Yes Historical Provider, MD  ezetimibe-simvastatin (VYTORIN)  10-10 MG per tablet Take 1 tablet by mouth every morning.    Yes Historical Provider, MD  fenofibrate (TRICOR) 48 MG tablet Take 48 mg by mouth every morning.    Yes Historical Provider, MD  lisinopril (PRINIVIL,ZESTRIL) 10 MG tablet Take 10 mg by mouth every morning.    Yes Historical Provider, MD  zolpidem (AMBIEN) 5 MG tablet Take 10 mg by mouth at bedtime as needed. Sleep    Yes Historical Provider, MD    Allergies:  No Known Allergies  Social History:  reports that he has quit smoking. He quit smokeless tobacco use about 39 years ago. He reports that he drinks about 3.6 ounces of alcohol per week. He reports that he does not use illicit drugs.  Family History: History reviewed. No pertinent family history.  Physical Exam: Filed Vitals:   07/10/12 1930 07/10/12 2000 07/10/12 2100 07/10/12 2204  BP: 146/75 104/68 116/60 141/75  Pulse:   64 74  Temp:    98.1 F (36.7 C)  TempSrc:    Oral  Resp: 22 17 21 17   Height:    6\' 1"  (1.854 m)  Weight:    93.123 kg (205 lb 4.8 oz)  SpO2:   98% 100%   General appearance: alert, cooperative and no distress Neck: no JVD and supple, symmetrical, trachea midline Lungs:  clear to auscultation bilaterally Heart: regular rate and rhythm, S1, S2 normal, no murmur, click, rub or gallop Abdomen: soft, non-tender; bowel sounds normal; no masses,  no organomegaly Extremities: extremities normal, atraumatic, no cyanosis or edema Pulses: 2+ and symmetric Skin: Skin color, texture, turgor normal. No rashes or lesions Neurologic: Grossly normal 5/5 strength ble and bue.  Reflexes normal.  Speech normal.    Labs on Admission:   [facility] 07/10/12 1651  NA 138  K 4.6  CL 104  CO2 25  GLUCOSE 104*  BUN 47*  CREATININE 2.40*  CALCIUM 9.3  MG --  PHOS --    Basename 07/10/12 1651  AST 12  ALT 8  ALKPHOS 35*  BILITOT 0.2*  PROT 6.6  ALBUMIN 3.7    Basename 07/10/12 1651  WBC 5.3  NEUTROABS 3.0  HGB 11.2*  HCT 35.4*  MCV 96.5    PLT 181    Basename 07/10/12 1651  CKTOTAL --  CKMB --  CKMBINDEX --  TROPONINI <0.30   Radiological Exams on Admission: Mr Brain Wo Contrast  07/10/2012  *RADIOLOGY REPORT*  Clinical Data: Left hand weakness beginning this morning.  History of hyperlipidemia.  History of prior smoking.  History of hypertension.  History of prior cerebral infarction.  MRI HEAD WITHOUT CONTRAST  Technique:  Multiplanar, multiecho pulse sequences of the brain and surrounding structures were obtained according to standard protocol without intravenous contrast.  Comparison: 03/07/2010.  Findings: There is a 5 x 10 mm acute right posterior frontal cortical and subcortical infarction without hemorrhage or mass effect.  This is on the opposite side of the previous left hemisphere acute infarction demonstrated in 2011.  There is no hydrocephalus or extra-axial fluid.  There is moderately advanced atrophy for the patient's chronologic age of 89.  There is extensive chronic microvascular ischemic change in the periventricular and subcortical white matter, likely secondary hypertension.  No foci of chronic hemorrhage are definitely seen.  Numerous remote lacunar infarcts affect the cerebral hemispheres most notable in the left periventricular region.  The pituitary and cerebellar tonsils are unremarkable.  There are no worrisome osseous lesions.  Flow voids are maintained in the carotid, basilar, and vertebral arteries suggesting patency.  There are no acute orbital, sinus, or mastoid findings.  Compared with priors, there is slight progression of atrophy and chronic microvascular ischemic change.  IMPRESSION: Acute 5 x 10 mm right posterior frontal cortical and subcortical infarction without hemorrhage or mass effect.  Advanced atrophy and chronic microvascular ischemic change, with slight progression from 2011.   Original Report Authenticated By: Davonna Belling, M.D.     Assessment/Plan  77 yo male with acute cva Principal  Problem:  *CVA (cerebral vascular accident) Active Problems:  HYPERLIPIDEMIA  Hypertension  Chronic kidney disease  Ck carotids.  Ck 2 D echo.  Place on aggrenox instead of asa full dose.  Neuro cks overnight.  Does not follow with neurology anymore so needs to reestablish.  Full code.  cva pathway.  Yohance Hathorne A 07/10/2012, 11:07 PM

## 2012-07-11 LAB — CBC
MCH: 30.2 pg (ref 26.0–34.0)
MCHC: 32.3 g/dL (ref 30.0–36.0)
Platelets: 183 10*3/uL (ref 150–400)
RDW: 14.9 % (ref 11.5–15.5)

## 2012-07-11 LAB — LIPID PANEL
HDL: 41 mg/dL (ref >39)
LDL Cholesterol: 71 mg/dL (ref 0–99)
Total CHOL/HDL Ratio: 3.7 RATIO
Triglycerides: 197 mg/dL — ABNORMAL HIGH (ref <150)
VLDL: 39 mg/dL (ref 0–40)

## 2012-07-11 LAB — CREATININE, SERUM
Creatinine, Ser: 2.31 mg/dL — ABNORMAL HIGH (ref 0.50–1.35)
GFR calc non Af Amer: 26 mL/min — ABNORMAL LOW (ref >90)

## 2012-07-11 MED ORDER — ACETAMINOPHEN 325 MG PO TABS
650.0000 mg | ORAL_TABLET | Freq: Four times a day (QID) | ORAL | Status: DC | PRN
  Administered 2012-07-11 (×2): 650 mg via ORAL
  Filled 2012-07-11 (×2): qty 2

## 2012-07-11 MED ORDER — SIMVASTATIN 10 MG PO TABS
10.0000 mg | ORAL_TABLET | Freq: Every day | ORAL | Status: DC
  Administered 2012-07-11: 10 mg via ORAL
  Filled 2012-07-11 (×2): qty 1

## 2012-07-11 MED ORDER — SENNOSIDES-DOCUSATE SODIUM 8.6-50 MG PO TABS
1.0000 | ORAL_TABLET | Freq: Every day | ORAL | Status: DC
  Administered 2012-07-11: 1 via ORAL
  Filled 2012-07-11 (×2): qty 1

## 2012-07-11 MED ORDER — ZOLPIDEM TARTRATE 5 MG PO TABS
5.0000 mg | ORAL_TABLET | Freq: Every evening | ORAL | Status: DC | PRN
  Filled 2012-07-11: qty 1

## 2012-07-11 MED ORDER — EZETIMIBE 10 MG PO TABS
10.0000 mg | ORAL_TABLET | Freq: Every day | ORAL | Status: DC
  Administered 2012-07-11 – 2012-07-12 (×2): 10 mg via ORAL
  Filled 2012-07-11 (×2): qty 1

## 2012-07-11 NOTE — Evaluation (Signed)
Physical Therapy Evaluation Patient Details Name: Jason [name] MRN: [id number] DOB: December 18, 1935 Today's Date: 07/11/2012 Time: 1308-6578 PT Time Calculation (min): 16 min  PT Assessment / Plan / Recommendation Clinical Impression  Pt admitted with L sided hand weakness, MRI found frontal CVA. Pt presenting with minor balance deficits, although no further intervention needed. Discussed with pt to contact someone if he notices any changes in his gait pattern or balance for further therapy. No further acute PT needs, will not follow    PT Assessment  Patent does not need any further PT services    Follow Up Recommendations  No PT follow up;Supervision - Intermittent    Does the patient have the potential to tolerate intense rehabilitation      Barriers to Discharge        Equipment Recommendations  None recommended by PT    Recommendations for Other Services     Frequency      Precautions / Restrictions Precautions Precautions: None Restrictions Weight Bearing Restrictions: No   Pertinent Vitals/Pain No complaints of pain      Mobility  Bed Mobility Bed Mobility: Supine to Sit;Sitting - Scoot to Edge of Bed Supine to Sit: 6: Modified independent (Device/Increase time) Sitting - Scoot to Edge of Bed: 6: Modified independent (Device/Increase time) Details for Bed Mobility Assistance: slow movement although no assist needed Transfers Transfers: Sit to Stand;Stand to Sit Sit to Stand: 6: Modified independent (Device/Increase time) Stand to Sit: 6: Modified independent (Device/Increase time) Ambulation/Gait Ambulation/Gait Assistance: 4: Min guard Ambulation Distance (Feet): 150 Feet Assistive device: None Ambulation/Gait Assistance Details: Minguard assist for safety only. No LOB. Pt with history of knee replacement, decreased ROM in R knee causing slight limp Gait Pattern: Decreased stride length Gait velocity: slow Stairs: Yes Stairs Assistance: 5:  Supervision Stairs Assistance Details (indicate cue type and reason): Pt with use of R rail descending for support secondary to R knee decreased ROM Stair Management Technique: One rail Right;No rails;Step to pattern;Forwards Number of Stairs: 5  Modified Rankin (Stroke Patients Only) Pre-Morbid Rankin Score: No symptoms Modified Rankin: Slight disability    Shoulder Instructions     Exercises     PT Diagnosis:    PT Problem List:   PT Treatment Interventions:     PT Goals Acute Rehab PT Goals PT Goal Formulation: With patient  Visit Information  Last PT Received On: 07/11/12 Assistance Needed: +1    Subjective Data      Prior Functioning  Home Living Lives With: Spouse Available Help at Discharge: Family;Available 24 hours/day Type of Home: House Home Access: Stairs to enter [employer] of Steps: 1 Entrance Stairs-Rails: Right Home Layout: Two level;Able to live on main level with bedroom/bathroom Alternate Level Stairs-Number of Steps: 12 Alternate Level Stairs-Rails: Can reach both Bathroom Shower/Tub: Health visitor: Standard Bathroom Accessibility: Yes How Accessible: Accessible via walker Home Adaptive Equipment: Walker - rolling;Straight cane;Crutches;Bedside commode/3-in-1;Shower chair with back Prior Function Level of Independence: Independent Able to Take Stairs?: Yes Driving: Yes 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 Dominant Hand: Right    Cognition  Overall Cognitive Status: Appears within functional limits for tasks assessed/performed Arousal/Alertness: Awake/alert Orientation Level: Appears intact for tasks assessed Behavior During Session: [facility] for tasks performed    Extremity/Trunk Assessment Right Lower Extremity Assessment RLE ROM/Strength/Tone: Within functional levels RLE Sensation: WFL - Light Touch Left Lower Extremity  Assessment LLE ROM/Strength/Tone: Within functional levels LLE Sensation: WFL -  Light Touch   Balance Standardized Balance Assessment Standardized Balance Assessment: Dynamic Gait Index Dynamic Gait Index Level Surface: Normal Change in Gait Speed: Normal Gait with Horizontal Head Turns: Normal Gait with Vertical Head Turns: Mild Impairment Gait and Pivot Turn: Mild Impairment Step Over Obstacle: Mild Impairment Step Around Obstacles: Normal Steps: Mild Impairment Total Score: 20   End of Session PT - End of Session Equipment Utilized During Treatment: Gait belt Activity Tolerance: Patient tolerated treatment well Patient left: in chair;Other (comment) (with OT) Nurse Communication: Mobility status  GP     Milana Kidney 07/11/2012, 11:33 AM  07/11/2012 Milana Kidney DPT PAGER: [phone] OFFICE: [phone]

## 2012-07-11 NOTE — Progress Notes (Signed)
[facility] PROGRESS NOTE  ANVITH MAURIELLO ZOX:[id number] DOB: [birth year] DOA: 07/10/2012 PCP: Aura Dials, MD  Assessment/Plan: 1. CVA - patient was already on aspirin 325 mg daily. We have started him on Aggrenox once in the hospital. Neurology service was consulted and will see the patient later today. We appreciate their input. 2. CKD - creatinine is at baseline since 2013 we'll continue to monitor.  Code Status: Full Family Communication: Patient and wife (indicate person spoken with, relationship, and if by phone, the number) Disposition Plan: Home in one to 2 days  Consultants:  Neurology  Procedures:  None  Antibiotics:  None (indicate start date, and stop date if known)  HPI/Subjective: Denies any complaints this morning other than his left hand being numb  Objective: Filed Vitals:   07/11/12 0127 07/11/12 0334 07/11/12 0515 07/11/12 1011  BP: 111/71 118/64 116/66 118/59  Pulse: 77 77 77 58  Temp: 98.2 F (36.8 C)  98.3 F (36.8 C) 98 F (36.7 C)  TempSrc: Oral  Oral Oral  Resp: 18 17 18 18   Height:      Weight:      SpO2: 100% 100% 98% 98%    Intake/Output Summary (Last 24 hours) at 07/11/12 1118 Last data filed at 07/11/12 0500  Gross per 24 hour  Intake      0 ml  Output    500 ml  Net   -500 ml   Filed Weights   07/10/12 1605 07/10/12 2204  Weight: 92.987 kg (205 lb) 93.123 kg (205 lb 4.8 oz)    Exam:   General:  He is in no distress  Cardiovascular: His heart is regular without murmurs rubs or gallops  Respiratory: The lungs are clear to auscultation bilaterally  Abdomen: Soft non-tender  Neuro exam: Cranial nerves II through XII intact. Muscle strength 5-/5 left hand, 5/5 throughout. Sensation decreased over the left hand. DTRs normal.  Data Reviewed: Basic Metabolic Panel:  Lab 07/11/12 8119 07/10/12 1651  NA -- 138  K -- 4.6  CL -- 104  CO2 -- 25  GLUCOSE -- 104*  BUN -- 47*  CREATININE 2.31* 2.40*  CALCIUM --  9.3  MG -- --  PHOS -- --   Liver Function Tests:  Lab 07/10/12 1651  AST 12  ALT 8  ALKPHOS 35*  BILITOT 0.2*  PROT 6.6  ALBUMIN 3.7   CBC:  Lab 07/11/12 0101 07/10/12 1651  WBC 5.2 5.3  NEUTROABS -- 3.0  HGB 11.1* 11.2*  HCT 34.4* 35.4*  MCV 93.7 96.5  PLT 183 181   Cardiac Enzymes:  Lab 07/10/12 1651  CKTOTAL --  CKMB --  CKMBINDEX --  TROPONINI <0.30   CBG:  Lab 07/10/12 1709  GLUCAP 90    Studies: Mr Brain Wo Contrast  07/10/2012  *RADIOLOGY REPORT*  Clinical Data: Left hand weakness beginning this morning.  History of hyperlipidemia.  History of prior smoking.  History of hypertension.  History of prior cerebral infarction.  MRI HEAD WITHOUT CONTRAST  Technique:  Multiplanar, multiecho pulse sequences of the brain and surrounding structures were obtained according to standard protocol without intravenous contrast.  Comparison: 03/07/2010.  Findings: There is a 5 x 10 mm acute right posterior frontal cortical and subcortical infarction without hemorrhage or mass effect.  This is on the opposite side of the previous left hemisphere acute infarction demonstrated in 2011.  There is no hydrocephalus or extra-axial fluid.  There is moderately advanced atrophy for the patient's chronologic age  of 76.  There is extensive chronic microvascular ischemic change in the periventricular and subcortical white matter, likely secondary hypertension.  No foci of chronic hemorrhage are definitely seen.  Numerous remote lacunar infarcts affect the cerebral hemispheres most notable in the left periventricular region.  The pituitary and cerebellar tonsils are unremarkable.  There are no worrisome osseous lesions.  Flow voids are maintained in the carotid, basilar, and vertebral arteries suggesting patency.  There are no acute orbital, sinus, or mastoid findings.  Compared with priors, there is slight progression of atrophy and chronic microvascular ischemic change.  IMPRESSION: Acute 5 x 10  mm right posterior frontal cortical and subcortical infarction without hemorrhage or mass effect.  Advanced atrophy and chronic microvascular ischemic change, with slight progression from 2011.   Original Report Authenticated By: Davonna Belling, M.D.     Scheduled Meds:   . dipyridamole-aspirin  1 capsule Oral BID  . enoxaparin  30 mg Subcutaneous Q24H  . ezetimibe  10 mg Oral Daily  . lisinopril  10 mg Oral BH-q7a  . simvastatin  10 mg Oral q1800   Continuous Infusions:   Principal Problem:  *CVA (cerebral vascular accident) Active Problems:  HYPERLIPIDEMIA  Hypertension  Chronic kidney disease   Pamella Pert  [facility] Pager [phone]. If 8PM-8AM, please contact night-coverage at [email or web address], password [facility] 07/11/2012, 11:18 AM  LOS: 1 day

## 2012-07-11 NOTE — Progress Notes (Signed)
Utilization review completed.  

## 2012-07-11 NOTE — Progress Notes (Signed)
Occupational Therapy Evaluation Patient Details Name: Jason [name] MRN: [id number] DOB: [birth year] Today's Date: 07/11/2012 Time: 1106-1130 OT Time Calculation (min): 24 min  OT Assessment / Plan / Recommendation Clinical Impression  77 yo s/p previous CVA presented with increased L hand weakness. Pt with acute CVA  in R post subcortical and cortical area. Pt presents with primarily motor and minimal sensory deficits L hand. Pt will benefit from skilled OT services to max independence with ADL and functional use L hand due to below deficits. OK for intermittent S for D/C home - wife can provide. Educated pt on s/s of CVA.    OT Assessment  Patient needs continued OT Services    Follow Up Recommendations  Outpatient OT;Other (comment) (neuro outpt center)    Barriers to Discharge None    Equipment Recommendations  None recommended by OT    Recommendations for Other Services  none  Frequency  Min 2X/week    Precautions / Restrictions Precautions Precautions: None Restrictions Weight Bearing Restrictions: No   Pertinent Vitals/Pain No c/o pain    ADL  Eating/Feeding: Set up Where Assessed - Eating/Feeding: Chair Grooming: Minimal assistance Where Assessed - Grooming: Unsupported sitting Upper Body Bathing: Minimal assistance Where Assessed - Upper Body Bathing: Unsupported sitting Lower Body Bathing: Set up Where Assessed - Lower Body Bathing: Unsupported sit to stand Upper Body Dressing: Minimal assistance Where Assessed - Upper Body Dressing: Unsupported sitting Lower Body Dressing: Moderate assistance Where Assessed - Lower Body Dressing: Unsupported sit to stand Toilet Transfer: Modified independent Toilet Transfer Method: Sit to stand Toilet Transfer Equipment: Comfort height toilet Toileting - Clothing Manipulation and Hygiene: Minimal assistance Where Assessed - Engineer, mining and Hygiene: Standing Equipment Used: Gait  belt Transfers/Ambulation Related to ADLs: S ADL Comments: Limited by L hand weakness & coordination deficits    OT Diagnosis: Generalized weakness;Paresis;Ataxia  OT Problem List: Decreased strength;Decreased range of motion;Decreased coordination;Impaired sensation;Impaired UE functional use OT Treatment Interventions: Self-care/ADL training;Therapeutic exercise;Neuromuscular education;Therapeutic activities;Patient/family education   OT Goals Acute Rehab OT Goals OT Goal Formulation: With patient Time For Goal Achievement: 07/18/12 Potential to Achieve Goals: Good ADL Goals Pt Will Perform Upper Body Dressing: with set-up;with supervision;Unsupported ADL Goal: Upper Body Dressing - Progress: Goal set today Pt Will Perform Lower Body Dressing: with set-up;with supervision;Unsupported ADL Goal: Lower Body Dressing - Progress: Goal set today Arm Goals Pt Will Perform AROM: with supervision, verbal cues required/provided;Left upper extremity (grasp/release/opposition) Arm Goal: AROM - Progress: Goal set today Pt Will Complete Theraputty Exer: with supervision, verbal cues required/provided;to increase strength;Left upper extremity;Min resistance putty Arm Goal: Theraputty Exercises - Progress: Goal set today Additional Arm Goal #1: Verbalize understadning of fine motor coordiantion HEP Arm Goal: Additional Goal #1 - Progress: Goal set today  Visit Information  Last OT Received On: 07/11/12 Assistance Needed: +1 PT/OT Co-Evaluation/Treatment: Yes    Subjective Data   I just can't use my hand   Prior Functioning     Home Living Lives With: Spouse Available Help at Discharge: Family;Available 24 hours/day Type of Home: House Home Access: Stairs to enter [employer] of Steps: 1 Entrance Stairs-Rails: Right Home Layout: Two level;Able to live on main level with bedroom/bathroom Alternate Level Stairs-Number of Steps: 12 Alternate Level Stairs-Rails: Can reach  both Bathroom Shower/Tub: Health visitor: Standard Bathroom Accessibility: Yes How Accessible: Accessible via walker Home Adaptive Equipment: Walker - rolling;Straight cane;Crutches;Bedside commode/3-in-1;Shower chair with back Prior Function Level of Independence: Independent Able to Take Stairs?: Yes  Driving: Yes 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 Dominant Hand: Right         Vision/Perception Vision - Assessment Eye Alignment: Within Chemical engineer Perception: Within Functional Limits Praxis Praxis: Intact   Cognition  Overall Cognitive Status: Appears within functional limits for tasks assessed/performed Arousal/Alertness: Awake/alert Orientation Level: Appears intact for tasks assessed Behavior During Session: [facility] for tasks performed    Extremity/Trunk Assessment Right Upper Extremity Assessment RUE ROM/Strength/Tone: WFL for tasks assessed RUE Sensation: WFL - Light Touch;WFL - Proprioception RUE Coordination: WFL - gross/fine motor Left Upper Extremity Assessment LUE ROM/Strength/Tone: Deficits LUE ROM/Strength/Tone Deficits: Brunstrom stage IV hand. stage VI arm LUE Sensation: Deficits LUE Sensation Deficits: decreased light touch L hand LUE Coordination: Deficits LUE Coordination Deficits: gross grasp/ release. ataxic. unable to oppose Right Lower Extremity Assessment RLE ROM/Strength/Tone: [facility] for tasks assessed RLE Sensation: WFL - Light Touch Left Lower Extremity Assessment LLE ROM/Strength/Tone: WFL for tasks assessed LLE Sensation: WFL - Light Touch Trunk Assessment Trunk Assessment: Normal     Mobility Bed Mobility Bed Mobility: Supine to Sit Supine to Sit: 6: Modified independent (Device/Increase time) Sitting - Scoot to Edge of Bed: 6: Modified independent (Device/Increase time) Details for Bed Mobility Assistance: slow movement  although no assist needed Transfers Transfers: Sit to Stand;Stand to Sit Sit to Stand: 6: Modified independent (Device/Increase time);With upper extremity assist;From bed Stand to Sit: 6: Modified independent (Device/Increase time);To chair/3-in-1;With upper extremity assist Details for Transfer Assistance: good safety awareness     Shoulder Instructions     Exercise Hand Exercises Wrist Flexion: AROM;Left;5 reps Wrist Extension: AROM;Left;5 reps Wrist Ulnar Deviation: AROM;Left;5 reps Wrist Radial Deviation: AROM;Left;5 reps Digit Composite Flexion: AROM;Left;5 reps Composite Extension: AROM;Left;5 reps;Limitations (unable to achieve full extension) Digit Composite Abduction: Limitations (unable) Digit Composite Adduction: Limitations (unable) Digit Lifts: Limitations (unable) Thumb Abduction: Limitations (unalbe) Thumb Adduction: AROM;Left;5 reps Opposition: Limitations (unable to oppose thumb to fingertips) Opposition Limitations: ataxic moevemnt    Balance Standardized Balance Assessment Standardized Balance Assessment: Dynamic Gait Index Dynamic Gait Index Level Surface: Normal Change in Gait Speed: Normal Gait with Horizontal Head Turns: Normal Gait with Vertical Head Turns: Mild Impairment Gait and Pivot Turn: Mild Impairment Step Over Obstacle: Mild Impairment Step Around Obstacles: Normal Steps: Mild Impairment Total Score: 20    End of Session OT - End of Session Equipment Utilized During Treatment: Gait belt Activity Tolerance: Patient tolerated treatment well Patient left: in chair;with call bell/phone within reach (ST with pt) Nurse Communication: Mobility status;Other (comment) (need for neuro outpt therapy)  GO     Raine Blodgett,HILLARY 07/11/2012, 12:36 PM [facility], OTR/L  [phone] 07/11/2012

## 2012-07-11 NOTE — Progress Notes (Signed)
Occupational Therapy Treatment Patient Details Name: Jason GRANHOLM MRN: [id number] DOB: January 13, 1936 Today's Date: 07/11/2012 Time: 4098-1191 OT Time Calculation (min): 11 min  OT Assessment / Plan / Recommendation Comments on Treatment Session Pt seen this pm for further education on R hand neuro education with daughter present. Pt with spontaneous use L hand. Educated pt on importance of trying to use hand in functional activities in addition to completing exercises. Pt given theraputty (min resistance) exercises to complete. Pt/daughter verbalized understanding. Pt ready to D/C home and continue with outpt OT.    Follow Up Recommendations  Outpatient OT;Other (comment)    Barriers to Discharge  None    Equipment Recommendations  None recommended by OT    Recommendations for Other Services    Frequency Min 2X/week   Plan Discharge plan remains appropriate    Precautions / Restrictions Precautions Precautions: None   Pertinent Vitals/Pain No c/o pain    ADL  Eating/Feeding: Set up Where Assessed - Eating/Feeding: Chair Grooming: Minimal assistance Where Assessed - Grooming: Unsupported sitting Upper Body Bathing: Minimal assistance Where Assessed - Upper Body Bathing: Unsupported sitting Lower Body Bathing: Set up Where Assessed - Lower Body Bathing: Unsupported sit to stand Upper Body Dressing: Minimal assistance Where Assessed - Upper Body Dressing: Unsupported sitting Lower Body Dressing: Moderate assistance Where Assessed - Lower Body Dressing: Unsupported sit to stand Toilet Transfer: Modified independent Toilet Transfer Method: Sit to stand Toilet Transfer Equipment: Comfort height toilet Toileting - Clothing Manipulation and Hygiene: Minimal assistance Where Assessed - Engineer, mining and Hygiene: Standing Equipment Used: Gait belt Transfers/Ambulation Related to ADLs: S ADL Comments: focus of session on teaching pt theraputty exercises and  completing education with pt's daughter. Given handout    OT Diagnosis: Generalized weakness;Paresis;Ataxia  OT Problem List: Decreased strength;Decreased range of motion;Decreased coordination;Impaired sensation;Impaired UE functional use OT Treatment Interventions: Self-care/ADL training;Therapeutic exercise;Neuromuscular education;Therapeutic activities;Patient/family education   OT Goals Acute Rehab OT Goals OT Goal Formulation: With patient Time For Goal Achievement: 07/18/12 Potential to Achieve Goals: Good ADL Goals Pt Will Perform Upper Body Dressing: with set-up;with supervision;Unsupported ADL Goal: Upper Body Dressing - Progress: Progressing toward goals Pt Will Perform Lower Body Dressing: with set-up;with supervision;Unsupported ADL Goal: Lower Body Dressing - Progress: Progressing toward goals Arm Goals Pt Will Perform AROM: with supervision, verbal cues required/provided;Left upper extremity Arm Goal: AROM - Progress: Progressing toward goal Pt Will Complete Theraputty Exer: with supervision, verbal cues required/provided;to increase strength;Left upper extremity;Min resistance putty Arm Goal: Theraputty Exercises - Progress: Progressing toward goal Additional Arm Goal #1: Verbalize understadning of fine motor coordiantion HEP Arm Goal: Additional Goal #1 - Progress: Progressing toward goals  Visit Information  Last OT Received On: 07/11/12 Assistance Needed: +1 PT/OT Co-Evaluation/Treatment: Yes    Subjective Data   I'm familiar with this   Prior Functioning  Home Living Lives With: Spouse Available Help at Discharge: Family;Available 24 hours/day Type of Home: House Home Access: Stairs to enter [employer] of Steps: 1 Entrance Stairs-Rails: Right Home Layout: Two level;Able to live on main level with bedroom/bathroom Alternate Level Stairs-Number of Steps: 12 Alternate Level Stairs-Rails: Can reach both Bathroom Shower/Tub: Architectural technologist: Standard Bathroom Accessibility: Yes How Accessible: Accessible via walker Home Adaptive Equipment: Walker - rolling;Straight cane;Crutches;Bedside commode/3-in-1;Shower chair with back Prior Function Level of Independence: Independent Able to Take Stairs?: Yes Driving: Yes Vocation: Part time employment Communication Communication: No difficulties Dominant Hand: Right    Cognition  Overall Cognitive Status: Appears  within functional limits for tasks assessed/performed Arousal/Alertness: Awake/alert Orientation Level: Appears intact for tasks assessed Behavior During Session: [facility] for tasks performed    Mobility  Shoulder Instructions Bed Mobility Bed Mobility: Supine to Sit Supine to Sit: 6: Modified independent (Device/Increase time) Sitting - Scoot to Edge of Bed: 6: Modified independent (Device/Increase time) Transfers Transfers: Sit to Stand;Stand to Sit Sit to Stand: 6: Modified independent (Device/Increase time);With upper extremity assist;From bed Stand to Sit: 6: Modified independent (Device/Increase time);To chair/3-in-1;With upper extremity assist Details for Transfer Assistance: good safety awareness       Exercises  Hand Exercises Wrist Flexion: AROM;Left;5 reps Wrist Extension: AROM;Left;5 reps Wrist Ulnar Deviation: AROM;Left;5 reps Wrist Radial Deviation: AROM;Left;5 reps Digit Composite Flexion: AROM;Left;5 reps Composite Extension: AROM;Left;5 reps;Limitations (unable to achieve full extension) Digit Composite Abduction: Limitations (unable) Digit Composite Adduction: Limitations (unable) Digit Lifts: Limitations (unable) Thumb Abduction: Limitations (unalbe) Thumb Adduction: AROM;Left;5 reps Opposition: Limitations (unable to oppose thumb to fingertips) Opposition Limitations: ataxic moevemnt    Balance     End of Session OT - End of Session Equipment Utilized During Treatment: Gait belt Activity Tolerance: Patient  tolerated treatment well Patient left: in bed;with call bell/phone within reach;with family/visitor present Nurse Communication: Mobility status;Other (comment)  GO     Garima Chronis,HILLARY 07/11/2012, 3:27 PM [facility], OTR/L  [phone] 07/11/2012

## 2012-07-11 NOTE — Consult Note (Addendum)
Referring Physician:    Chief Complaint: left hand weakness.  HPI:                                                                                                                                         Jason [name] is an 77 y.o. male right handed male with a past medical history significant for hypertension, hypercholesterolemia, TIA in 2011 characterized by language impairment, who was doing quite well until the morning of 07/10/12 when he woke up and noticed lack of function of the left hand. He said that he did not seek immediate medical attention because he thought it will go away. However, the lack of function persisted and he had troubles getting dress and therefore decided to go to the hospital several hours later. Jason [name] associated headache, vertigo, double vision, difficulty swallowing, focal numbness or tingling, dysequilibrium, slurred speech, language or visual impairment. DWI-MRI performed during this hospitalization disclosed an acute infarct involving the right posterior frontal cortical and subcortical region without associated edema or hemorrhage. Jason [name] that he has been taking religiously his aspirin, simvastatin, and lisinopril. He was just switched from aspirin to aggrenox here in the hospital. Of note, he said that he is enrolled in a clinical trial but hasn't seen Jason [name] in at least a year.       LSN: 07/09/12 PM. tPA Given: no, due to late presentation.  Past Medical History  Diagnosis Date  . Allergic rhinitis, cause unspecified   . Headache   . Pure hyperglyceridemia   . Unspecified transient cerebral ischemia 02/2010  . Chronic kidney disease   . Organic insomnia, unspecified   . GERD (gastroesophageal reflux disease)   . Recurrent upper respiratory infection (URI)     states chest congestion x 3-4 weeks, no fever- states was productive small amt yellow phlegm, now is clear  . Hypertension     stress test 5 years ago, eccho 9/11 on  chart, Dr. Brown Human  . Stroke     left basal ganglia infarction 9/11- LOV DR Pearlean [name] 7/12 on chart  . Unspecified cerebral artery occlusion with cerebral infarction     Past Surgical History  Procedure Date  . Knee surgery     x2  . Joint replacement     right total knee replacement x 2  . Tonsillectomy   . Umbilical hernia repair 05/21/2011    Procedure: HERNIA REPAIR UMBILICAL ADULT;  Surgeon: Shelly Rubenstein, MD;  Location: [facility];  Service: General;  Laterality: N/A;  . Cholecystectomy 06/17/2012    Procedure: LAPAROSCOPIC CHOLECYSTECTOMY;  Surgeon: Axel Filler, MD;  Location: [facility] OR;  Service: General;  Laterality: N/A;    History reviewed. No pertinent family history. Social History:  reports that he has quit smoking. He quit smokeless tobacco use about 39 years ago. He reports that he drinks about 3.6 ounces of alcohol  per week. He reports that he does not use illicit drugs.  Allergies: No Known Allergies  Medications:                                                                                                                           I have reviewed the patient's current medications.  ROS:                                                                                                                                       History obtained from the patient  General ROS: negative for - chills, fatigue, fever, night sweats, weight gain or weight loss Psychological ROS: negative for - behavioral disorder, hallucinations, memory difficulties, mood swings or suicidal ideation Ophthalmic ROS: negative for - blurry vision, double vision, eye pain or loss of vision ENT ROS: negative for - epistaxis, nasal discharge, oral lesions, sore throat, tinnitus or vertigo Allergy and Immunology ROS: negative for - hives or itchy/watery eyes Hematological and Lymphatic ROS: negative for - bleeding problems, bruising or swollen lymph nodes Endocrine ROS: negative for - galactorrhea, hair  pattern changes, polydipsia/polyuria or temperature intolerance Respiratory ROS: negative for - cough, hemoptysis, shortness of breath or wheezing Cardiovascular ROS: negative for - chest pain, dyspnea on exertion, edema or irregular heartbeat Gastrointestinal ROS: negative for - abdominal pain, diarrhea, hematemesis, nausea/vomiting or stool incontinence Genito-Urinary ROS: negative for - dysuria, hematuria, incontinence or urinary frequency/urgency Musculoskeletal ROS: negative for - joint swelling or muscular weakness Neurological ROS: as noted in HPI Dermatological ROS: negative for rash and skin lesion changes  Physical exam:Blood pressure 118/59, pulse 58, temperature 98 F (36.7 C), temperature source Oral, resp. rate 18, height 6\' 1"  (1.854 m), weight 93.123 kg (205 lb 4.8 oz), SpO2 98.00%. Head: normocephalic.  Neck: supple, no bruits or JVD.  Heart: no murmurs, S3 or S4.  Lungs: clear.  Abdomen: soft, no organomegaly.  Extremities: symmetrical without edema.    Neurologic Examination:  Mental Status:  Alert, awake ,and oriented x 4. Comprehension, naming, and repetition normal. Speech fluent without evidence of aphasia.  Cranial Nerves:  II: Discs flat bilaterally; Visual fields grossly normal, pupils equal, round, reactive to light and accommodation  III,IV, VI: ptosis not present, extra-ocular motions intact bilaterally  V,VII: smile symmetric, facial light touch sensation normal bilaterally  VIII: hearing normal bilaterally  IX,X: gag reflex present  XI: bilateral shoulder shrug  XII: midline tongue extension  Motor: significant for mild weakness left hand, with decreased rapid alternating movements left hand.  Tone and bulk:normal tone throughout; no atrophy noted  Sensory: Pinprick and light touch intact throughout, bilaterally  Deep Tendon Reflexes: 2+ and symmetric  throughout  Plantars:  Right: downgoing Left: downgoing  Cerebellar:  normal finger-to-nose, normal heel-to-shin test  Gait: no frank ataxia or bradykinesia.  CV: pulses palpable throughout      Results for orders placed during the hospital encounter of 07/10/12 (from the past 48 hour(s))  PROTIME-INR     Status: Normal   Collection Time   07/10/12  4:51 PM      Component Value Range Comment   Prothrombin Time 13.1  11.6 - 15.2 seconds    INR 1.00  0.00 - 1.49   APTT     Status: Normal   Collection Time   07/10/12  4:51 PM      Component Value Range Comment   aPTT 32  24 - 37 seconds   CBC     Status: Abnormal   Collection Time   07/10/12  4:51 PM      Component Value Range Comment   WBC 5.3  4.0 - 10.5 K/uL    RBC 3.67 (*) 4.22 - 5.81 MIL/uL    Hemoglobin 11.2 (*) 13.0 - 17.0 g/dL    HCT 32.4 (*) 40.1 - 52.0 %    MCV 96.5  78.0 - 100.0 fL    MCH 30.5  26.0 - 34.0 pg    MCHC 31.6  30.0 - 36.0 g/dL    RDW 02.7  25.3 - 66.4 %    Platelets 181  150 - 400 K/uL   DIFFERENTIAL     Status: Normal   Collection Time   07/10/12  4:51 PM      Component Value Range Comment   Neutrophils Relative 57  43 - 77 %    Neutro Abs 3.0  1.7 - 7.7 K/uL    Lymphocytes Relative 28  12 - 46 %    Lymphs Abs 1.5  0.7 - 4.0 K/uL    Monocytes Relative 11  3 - 12 %    Monocytes Absolute 0.6  0.1 - 1.0 K/uL    Eosinophils Relative 3  0 - 5 %    Eosinophils Absolute 0.2  0.0 - 0.7 K/uL    Basophils Relative 1  0 - 1 %    Basophils Absolute 0.0  0.0 - 0.1 K/uL   COMPREHENSIVE METABOLIC PANEL     Status: Abnormal   Collection Time   07/10/12  4:51 PM      Component Value Range Comment   Sodium 138  135 - 145 mEq/L    Potassium 4.6  3.5 - 5.1 mEq/L    Chloride 104  96 - 112 mEq/L    CO2 25  19 - 32 mEq/L    Glucose, Bld 104 (*) 70 - 99 mg/dL    BUN 47 (*) 6 - 23 mg/dL    Creatinine,  Ser 2.40 (*) 0.50 - 1.35 mg/dL    Calcium 9.3  8.4 - 45.4 mg/dL    Total Protein 6.6  6.0 - 8.3 g/dL    Albumin  3.7  3.5 - 5.2 g/dL    AST 12  0 - 37 U/L    ALT 8  0 - 53 U/L    Alkaline Phosphatase 35 (*) 39 - 117 U/L    Total Bilirubin 0.2 (*) 0.3 - 1.2 mg/dL    GFR calc non Af Amer 25 (*) >90 mL/min    GFR calc Af Amer 29 (*) >90 mL/min   TROPONIN I     Status: Normal   Collection Time   07/10/12  4:51 PM      Component Value Range Comment   Troponin I <0.30  <0.30 ng/mL   GLUCOSE, CAPILLARY     Status: Normal   Collection Time   07/10/12  5:09 PM      Component Value Range Comment   Glucose-Capillary 90  70 - 99 mg/dL   URINALYSIS, ROUTINE W REFLEX MICROSCOPIC     Status: Normal   Collection Time   07/10/12  5:53 PM      Component Value Range Comment   Color, Urine YELLOW  YELLOW    APPearance CLEAR  CLEAR    Specific Gravity, Urine 1.011  1.005 - 1.030    pH 7.0  5.0 - 8.0    Glucose, UA NEGATIVE  NEGATIVE mg/dL    Hgb urine dipstick NEGATIVE  NEGATIVE    Bilirubin Urine NEGATIVE  NEGATIVE    Ketones, ur NEGATIVE  NEGATIVE mg/dL    Protein, ur NEGATIVE  NEGATIVE mg/dL    Urobilinogen, UA 0.2  0.0 - 1.0 mg/dL    Nitrite NEGATIVE  NEGATIVE    Leukocytes, UA NEGATIVE  NEGATIVE MICROSCOPIC NOT DONE ON URINES WITH NEGATIVE PROTEIN, BLOOD, LEUKOCYTES, NITRITE, OR GLUCOSE <1000 mg/dL.  CBC     Status: Abnormal   Collection Time   07/11/12  1:01 AM      Component Value Range Comment   WBC 5.2  4.0 - 10.5 K/uL    RBC 3.67 (*) 4.22 - 5.81 MIL/uL    Hemoglobin 11.1 (*) 13.0 - 17.0 g/dL    HCT 09.8 (*) 11.9 - 52.0 %    MCV 93.7  78.0 - 100.0 fL    MCH 30.2  26.0 - 34.0 pg    MCHC 32.3  30.0 - 36.0 g/dL    RDW 14.7  82.9 - 56.2 %    Platelets 183  150 - 400 K/uL   CREATININE, SERUM     Status: Abnormal   Collection Time   07/11/12  1:01 AM      Component Value Range Comment   Creatinine, Ser 2.31 (*) 0.50 - 1.35 mg/dL    GFR calc non Af Amer 26 (*) >90 mL/min    GFR calc Af Amer 30 (*) >90 mL/min   LIPID PANEL     Status: Abnormal   Collection Time   07/11/12  7:25 AM      Component  Value Range Comment   Cholesterol 151  0 - 200 mg/dL    Triglycerides 130 (*) <150 mg/dL    HDL 41  >86 mg/dL    Total CHOL/HDL Ratio 3.7      VLDL 39  0 - 40 mg/dL    LDL Cholesterol 71  0 - 99 mg/dL    Mr Brain Wo Contrast  07/10/2012  *RADIOLOGY REPORT*  Clinical Data: Left hand weakness beginning this morning.  History of hyperlipidemia.  History of prior smoking.  History of hypertension.  History of prior cerebral infarction.  MRI HEAD WITHOUT CONTRAST  Technique:  Multiplanar, multiecho pulse sequences of the brain and surrounding structures were obtained according to standard protocol without intravenous contrast.  Comparison: 03/07/2010.  Findings: There is a 5 x 10 mm acute right posterior frontal cortical and subcortical infarction without hemorrhage or mass effect.  This is on the opposite side of the previous left hemisphere acute infarction demonstrated in 2011.  There is no hydrocephalus or extra-axial fluid.  There is moderately advanced atrophy for the patient's chronologic age of 30.  There is extensive chronic microvascular ischemic change in the periventricular and subcortical white matter, likely secondary hypertension.  No foci of chronic hemorrhage are definitely seen.  Numerous remote lacunar infarcts affect the cerebral hemispheres most notable in the left periventricular region.  The pituitary and cerebellar tonsils are unremarkable.  There are no worrisome osseous lesions.  Flow voids are maintained in the carotid, basilar, and vertebral arteries suggesting patency.  There are no acute orbital, sinus, or mastoid findings.  Compared with priors, there is slight progression of atrophy and chronic microvascular ischemic change.  IMPRESSION: Acute 5 x 10 mm right posterior frontal cortical and subcortical infarction without hemorrhage or mass effect.  Advanced atrophy and chronic microvascular ischemic change, with slight progression from 2011.   Original Report Authenticated By: Davonna Belling, M.D.        Assessment: 77 y.o. male with risk factors for stroke that include age, hypertension, hypercholesterolemia, and previous TIA, admitted with left hand weakness and DWI-MRI revealing an acute right frontal infarct. Except for the left hand weakness, he is otherwise neurologically intact. He was already switched over from aspirin to aggrenox for secondary stroke prevention. Complete stroke work up. Stroke team with Jason [name] to resume care. Thanks for the kindness of this consultation.  Wyatt Portela, MD [facility] [phone]  07/11/2012, 10:55 AM

## 2012-07-12 MED ORDER — CLOPIDOGREL BISULFATE 75 MG PO TABS: 75.0000 mg | ORAL_TABLET | Freq: Every day | ORAL | Status: AC

## 2012-07-12 MED ORDER — SIMVASTATIN 10 MG PO TABS: 10.0000 mg | ORAL_TABLET | Freq: Every day | ORAL | Status: DC

## 2012-07-12 NOTE — Discharge Instructions (Signed)
STROKE/TIA DISCHARGE INSTRUCTIONS SMOKING Cigarette smoking nearly doubles your risk of having a stroke & is the single most alterable risk factor  If you smoke or have smoked in the last 12 months, you are advised to quit smoking for your health.  Most of the excess cardiovascular risk related to smoking disappears within a year of stopping.  Ask you doctor about anti-smoking medications  [place] Quit Line: 1-800-QUIT NOW  Free Smoking Cessation Classes [phone]  CHOLESTEROL Know your levels; limit fat & cholesterol in your diet  Lipid Panel     Component Value Date/Time   CHOL 151 07/11/2012 0725   TRIG 197* 07/11/2012 0725   HDL 41 07/11/2012 0725   CHOLHDL 3.7 07/11/2012 0725   VLDL 39 07/11/2012 0725   LDLCALC 71 07/11/2012 0725      Many patients benefit from treatment even if their cholesterol is at goal.  Goal: Total Cholesterol (CHOL) less than 160  Goal:  Triglycerides (TRIG) less than 150  Goal:  HDL greater than 40  Goal:  LDL (LDLCALC) less than 100   BLOOD PRESSURE American Stroke Association blood pressure target is less that 120/80 mm/Hg  Your discharge blood pressure is:  BP: 110/60 mmHg  Monitor your blood pressure  Limit your salt and alcohol intake  Many individuals will require more than one medication for high blood pressure  DIABETES (A1c is a blood sugar average for last 3 months) Goal HGBA1c is under 7% (HBGA1c is blood sugar average for last 3 months)  Diabetes: {STROKE DC DIABETES:22357}    Lab Results  Component Value Date   HGBA1C 5.8* 07/11/2012     Your HGBA1c can be lowered with medications, healthy diet, and exercise.  Check your blood sugar as directed by your physician  Call your physician if you experience unexplained or low blood sugars.  PHYSICAL ACTIVITY/REHABILITATION Goal is 30 minutes at least 4 days per week    {STROKE DC ACTIVITY/REHAB:22359}  Activity decreases your risk of heart attack and stroke and makes your heart  stronger.  It helps control your weight and blood pressure; helps you relax and can improve your mood.  Participate in a regular exercise program.  Talk with your doctor about the best form of exercise for you (dancing, walking, swimming, cycling).  DIET/WEIGHT Goal is to maintain a healthy weight  Your discharge diet is: General *** liquids Your height is:  Height: 6\' 1"  (185.4 cm) Your current weight is: Weight: 93.123 kg (205 lb 4.8 oz) Your Body Mass Index (BMI) is:  BMI (Calculated): 27.1   Following the type of diet specifically designed for you will help prevent another stroke.  Your goal weight range is:  ***  Your goal Body Mass Index (BMI) is 19-24.  Healthy food habits can help reduce 3 risk factors for stroke:  High cholesterol, hypertension, and excess weight.  RESOURCES Stroke/Support Group:  Call [phone]  they meet the 3rd Sunday of the month on the Rehab Unit at [facility], [place] ( no meetings June, July & Aug).  STROKE EDUCATION PROVIDED/REVIEWED AND GIVEN TO PATIENT Stroke warning signs and symptoms How to activate emergency medical system (call 911). Medications prescribed at discharge. Need for follow-up after discharge. Personal risk factors for stroke. Pneumonia vaccine given:   {STROKE DC YES/NO/DATE:22363} Flu vaccine given:   {STROKE DC YES/NO/DATE:22363} My questions have been answered, the writing is legible, and I understand these instructions.  I will adhere to these goals & educational materials that have  been provided to me after my discharge from the hospital.

## 2012-07-12 NOTE — Progress Notes (Signed)
Echocardiogram 2D Echocardiogram has been performed.  Jason [name] 07/12/2012, 11:55 AM

## 2012-07-12 NOTE — Progress Notes (Signed)
VASCULAR LAB PRELIMINARY  PRELIMINARY  PRELIMINARY  PRELIMINARY  Carotid duplex  completed.    Preliminary report:  Bilateral:  No evidence of hemodynamically significant internal carotid artery stenosis.   Vertebral artery flow is antegrade.      Lord Lancour, RVT 07/12/2012, 9:48 AM

## 2012-07-12 NOTE — Progress Notes (Signed)
Stroke Team Progress Note  HISTORY Jason [name] is an 77 y.o. male right handed male with a past medical history significant for hypertension, hypercholesterolemia, TIA in 2011 characterized by language impairment, who was doing quite well until the morning of 07/10/12 when he woke up and noticed lack of function of the left hand. He said that he did not seek immediate medical attention because he thought it will go away. However, the lack of function persisted and he had troubles getting dress and therefore decided to go to the hospital several hours later. [name] denied associated headache, vertigo, double vision, difficulty swallowing, focal numbness or tingling, dysequilibrium, slurred speech, language or visual impairment. DWI-MRI performed during this hospitalization disclosed an acute infarct involving the right posterior frontal cortical and subcortical region without associated edema or hemorrhage. [name] tells me that he has been taking religiously his aspirin, simvastatin, and lisinopril. He was just switched from aspirin to aggrenox here in the hospital. Of note, he said that he is enrolled in a clinical trial but hasn't seen Dr. Pearlean Brownie in at least a year.  SUBJECTIVE No family is at the bedside.  Overall he feels his condition is unchanged. Still with left hand weakness. Patient is enrolled in [facility]; he is followed in the office by [name].  OBJECTIVE Most recent Vital Signs: Filed Vitals:   07/11/12 1741 07/11/12 2135 07/12/12 0341 07/12/12 0628  BP: 121/75 134/67 96/66 110/60  Pulse: 70 68 40 65  Temp: 98.4 F (36.9 C) 97.9 F (36.6 C) 98 F (36.7 C) 98.1 F (36.7 C)  TempSrc: Oral Oral Oral Oral  Resp: 18 17 16 17   Height:      Weight:      SpO2: 98% 99% 93% 99%   CBG (last 3)   Basename 07/10/12 1709  GLUCAP 90    IV Fluid Intake:     MEDICATIONS    . dipyridamole-aspirin  1 capsule Oral BID  . enoxaparin  30 mg Subcutaneous Q24H  . ezetimibe  10 mg Oral  Daily  . lisinopril  10 mg Oral BH-q7a  . senna-docusate  1 tablet Oral Daily  . simvastatin  10 mg Oral q1800   PRN:  acetaminophen, zolpidem  Diet:  General thin liquids Activity:  As tolerated DVT Prophylaxis:  Lovenox 30 mg sq daily   CLINICALLY SIGNIFICANT STUDIES Basic Metabolic Panel:  Lab 07/11/12 2130 07/10/12 1651  NA -- 138  K -- 4.6  CL -- 104  CO2 -- 25  GLUCOSE -- 104*  BUN -- 47*  CREATININE 2.31* 2.40*  CALCIUM -- 9.3  MG -- --  PHOS -- --   Liver Function Tests:  Lab 07/10/12 1651  AST 12  ALT 8  ALKPHOS 35*  BILITOT 0.2*  PROT 6.6  ALBUMIN 3.7   CBC:  Lab 07/11/12 0101 07/10/12 1651  WBC 5.2 5.3  NEUTROABS -- 3.0  HGB 11.1* 11.2*  HCT 34.4* 35.4*  MCV 93.7 96.5  PLT 183 181   Coagulation:  Lab 07/10/12 1651  LABPROT 13.1  INR 1.00   Cardiac Enzymes:  Lab 07/10/12 1651  CKTOTAL --  CKMB --  CKMBINDEX --  TROPONINI <0.30   Urinalysis:  Lab 07/10/12 1753  COLORURINE YELLOW  LABSPEC 1.011  PHURINE 7.0  GLUCOSEU NEGATIVE  HGBUR NEGATIVE  BILIRUBINUR NEGATIVE  KETONESUR NEGATIVE  PROTEINUR NEGATIVE  UROBILINOGEN 0.2  NITRITE NEGATIVE  LEUKOCYTESUR NEGATIVE   Lipid Panel    Component Value Date/Time   CHOL  151 07/11/2012 0725   TRIG 197* 07/11/2012 0725   HDL 41 07/11/2012 0725   CHOLHDL 3.7 07/11/2012 0725   VLDL 39 07/11/2012 0725   LDLCALC 71 07/11/2012 0725   HgbA1C  Lab Results  Component Value Date   HGBA1C 5.8* 07/11/2012   Urine Drug Screen:   No results found for this basename: labopia, cocainscrnur, labbenz, amphetmu, thcu, labbarb    Alcohol Level: No results found for this basename: ETH:2 in the last 168 hours  CT of the brain    MRI of the brain  07/10/2012  Acute 5 x 10 mm right posterior frontal cortical and subcortical infarction without hemorrhage or mass effect.  Advanced atrophy and chronic microvascular ischemic change, with slight progression from 2011.    MRA of the brain    2D Echocardiogram     Carotid Doppler  Bilateral: No evidence of hemodynamically significant internal carotid artery stenosis. Vertebral artery flow is antegrade.   CXR  06/10/2013 Stable cardiopulmonary appearance with no new focal or acute  abnormality seen.  EKG  normal EKG, normal sinus rhythm, unchanged from previous tracings.   Therapy Recommendations OP OT, no PT  Physical Exam   Pleasant middle aged caucasian male not in distress.Awake alert. Afebrile. Head is nontraumatic. Neck is supple without bruit. Hearing is normal. Cardiac exam no murmur or gallop. Lungs are clear to auscultation. Distal pulses are well felt.  Neurological Exam ; Awake alert oriented x 3 normal speech and language.extraocular moments are full range without nystagmus. Fundi were not visualized. Visual acuity appears normal Mild left lower face asymmetry. Tongue midline. No drift. Mild diminished fine finger movements on left. Orbits right over left upper extremity. Moderate left grip and intrinsic hand muscle weakness.. Normal sensation . Normal coordination.  ASSESSMENT [name] is a 77 y.o. male presenting with left hand weakness. Imaging confirms a right posterior frontal cortical and subcortical infarct. Infarct felt to be thrombotic secondary to small vessel disease.  Work up completed. On dipyridamole SR 250 mg/aspirin 25 mg orally twice a day prior to admission. Now on dipyridamole SR 250 mg/aspirin 25 mg orally twice a day for secondary stroke prevention. Patient with resultant left hand weakness for which OP OT is recommended.  Hypertension Left basal ganglia stroke 9/11 d/t cerebral artery occlusion Patient currently enrolled in [facility] stroke prevention trial  Hospital day # 2  TREATMENT/PLAN  Change to  clopidogrel 75 mg orally every day for secondary stroke prevention.  Dr. Pearlean Brownie will notify [name] of pt's admission as he is enrolled in the [facility] trial.  OP OT  Ok for discharge from neuro  standpoint. Stroke Service will sign off. Follow up with Dr. Pearlean Brownie, Stroke Clinic, in 2 months.  Annie Main, MSN, RN, ANVP-BC, ANP-BC, [name] Pager: [phone] 07/12/2012 11:02 AM  I have personally obtained a history, examined the patient, evaluated imaging results, and formulated the assessment and plan of care. I agree with the above.  Delia Heady, MD Medical Director [facility] Pager: [phone] 07/12/2012 2:26 PM

## 2012-07-12 NOTE — Progress Notes (Signed)
OT Cancellation Note  Patient Details Name: JAVIOUS HALLISEY MRN: [id number] DOB: 05/09/1936   Cancelled Treatment:    Reason Eval/Treat Not Completed: Pt refused.  Anxious to leave hospital - pt. Irritated that he has not yet been discharged, and does not feel OT will help UE/hand function in this treatment session.   Jeani Hawking M [phone] 07/12/2012, 1:33 PM

## 2012-07-12 NOTE — Progress Notes (Signed)
[facility] PROGRESS NOTE  TYON CERASOLI ZOX:[id number] DOB: [birth year] DOA: 07/10/2012 PCP: Aura Dials, MD  Assessment/Plan: 1. CVA - patient was already on aspirin 325 mg daily. We have started him on Aggrenox once in the hospital. Neurology service was consulted and will see the patient today. We appreciate their input. Carotid Dopplers was completed this morning shows no evidence of hemodynamically significant internal carotid artery stenosis. 2. CKD - creatinine is at baseline since 2013 we'll continue to monitor. 2.3 this morning.   Code Status: Full Family Communication: Patient Disposition Plan: Home in one to 2 days  Consultants:  Neurology  Procedures:  None  Antibiotics:  None (indicate start date, and stop date if known)  HPI/Subjective: Denies any complaints this morning other than his left hand being numb  Objective: Filed Vitals:   07/11/12 1741 07/11/12 2135 07/12/12 0341 07/12/12 0628  BP: 121/75 134/67 96/66 110/60  Pulse: 70 68 40 65  Temp: 98.4 F (36.9 C) 97.9 F (36.6 C) 98 F (36.7 C) 98.1 F (36.7 C)  TempSrc: Oral Oral Oral Oral  Resp: 18 17 16 17   Height:      Weight:      SpO2: 98% 99% 93% 99%    Intake/Output Summary (Last 24 hours) at 07/12/12 1206 Last data filed at 07/12/12 0800  Gross per 24 hour  Intake    360 ml  Output    200 ml  Net    160 ml   Filed Weights   07/10/12 1605 07/10/12 2204  Weight: 92.987 kg (205 lb) 93.123 kg (205 lb 4.8 oz)    Exam:   General:  He is in no distress  Cardiovascular: His heart is regular without murmurs rubs or gallops  Respiratory: The lungs are clear to auscultation bilaterally  Abdomen: Soft non-tender  Neuro exam: Cranial nerves II through XII intact. Muscle strength 5-/5 left hand, 5/5 throughout. Sensation decreased over the left hand, subjectively improved since yesterday. DTRs normal.  Data Reviewed: Basic Metabolic Panel:  Lab 07/11/12 8119 07/10/12 1651    NA -- 138  K -- 4.6  CL -- 104  CO2 -- 25  GLUCOSE -- 104*  BUN -- 47*  CREATININE 2.31* 2.40*  CALCIUM -- 9.3  MG -- --  PHOS -- --   Liver Function Tests:  Lab 07/10/12 1651  AST 12  ALT 8  ALKPHOS 35*  BILITOT 0.2*  PROT 6.6  ALBUMIN 3.7   CBC:  Lab 07/11/12 0101 07/10/12 1651  WBC 5.2 5.3  NEUTROABS -- 3.0  HGB 11.1* 11.2*  HCT 34.4* 35.4*  MCV 93.7 96.5  PLT 183 181   Cardiac Enzymes:  Lab 07/10/12 1651  CKTOTAL --  CKMB --  CKMBINDEX --  TROPONINI <0.30   CBG:  Lab 07/10/12 1709  GLUCAP 90    Studies: Mr Brain Wo Contrast  07/10/2012  *RADIOLOGY REPORT*  Clinical Data: Left hand weakness beginning this morning.  History of hyperlipidemia.  History of prior smoking.  History of hypertension.  History of prior cerebral infarction.  MRI HEAD WITHOUT CONTRAST  Technique:  Multiplanar, multiecho pulse sequences of the brain and surrounding structures were obtained according to standard protocol without intravenous contrast.  Comparison: 03/07/2010.  Findings: There is a 5 x 10 mm acute right posterior frontal cortical and subcortical infarction without hemorrhage or mass effect.  This is on the opposite side of the previous left hemisphere acute infarction demonstrated in 2011.  There is no hydrocephalus or  extra-axial fluid.  There is moderately advanced atrophy for the patient's chronologic age of 60.  There is extensive chronic microvascular ischemic change in the periventricular and subcortical white matter, likely secondary hypertension.  No foci of chronic hemorrhage are definitely seen.  Numerous remote lacunar infarcts affect the cerebral hemispheres most notable in the left periventricular region.  The pituitary and cerebellar tonsils are unremarkable.  There are no worrisome osseous lesions.  Flow voids are maintained in the carotid, basilar, and vertebral arteries suggesting patency.  There are no acute orbital, sinus, or mastoid findings.  Compared with  priors, there is slight progression of atrophy and chronic microvascular ischemic change.  IMPRESSION: Acute 5 x 10 mm right posterior frontal cortical and subcortical infarction without hemorrhage or mass effect.  Advanced atrophy and chronic microvascular ischemic change, with slight progression from 2011.   Original Report Authenticated By: Davonna Belling, M.D.     Scheduled Meds:    . dipyridamole-aspirin  1 capsule Oral BID  . enoxaparin  30 mg Subcutaneous Q24H  . ezetimibe  10 mg Oral Daily  . lisinopril  10 mg Oral BH-q7a  . senna-docusate  1 tablet Oral Daily  . simvastatin  10 mg Oral q1800   Continuous Infusions:   Principal Problem:  *CVA (cerebral vascular accident) Active Problems:  HYPERLIPIDEMIA  Hypertension  Chronic kidney disease   Pamella Pert  [facility] Pager [phone]. If 8PM-8AM, please contact night-coverage at [email or web address], password [facility] 07/12/2012, 12:06 PM  LOS: 2 days

## 2012-07-13 NOTE — Discharge Summary (Signed)
Physician Discharge Summary  Jason [name] [id number]:[id number] DOB: 07/03/1935 DOA: 07/10/2012  PCP: Aura Dials, MD  Admit date: 07/10/2012 Discharge date: 07/13/2012  Time spent: 35 minutes  Recommendations for Outpatient Follow-up:  1. Follow up with Neurology  Discharge Diagnoses:  Principal Problem:  *CVA (cerebral vascular accident) Active Problems:  HYPERLIPIDEMIA  Hypertension  Chronic kidney disease  Discharge Condition: stable  Diet recommendation: heart healthy  Filed Weights   07/10/12 1605 07/10/12 2204  Weight: 92.987 kg (205 lb) 93.123 kg (205 lb 4.8 oz)    History of present illness:  77 yo male h/o tia, htn, hld with left hand numbness since early this am. Waited several hours and the numbness persisted so went to urgent care at around 3 pm and found to have acute infarct on mri. No slurred speech. No weakness in hand. No issues in ble. No n/t in right hand or arm. No fevers. No recetn illnesses. Takes asa 324mg  daily already. No headache. Still with numbness in all fingers left.  Hospital Course:  Neurology service was consulted upon Jason [name] arrival. He was on Aggrenox while hospitalized. TTE showed mild concentric hypertrophy otherwise unremarkable. Carotid doppler was without significant stenosis (full reads below). Per neurology recommendations, we have changed Jason [name] to Plavix 75 mg daily on discharge and will follow up with Dr. Pearlean [name] in Stroke clinic in ~ 2 months. Patient was afebrile and stable during his hospitalization. His neurologic exam was unchanged from admission to discharge. He has a history of CKD, and his Creatinine was at baseline here.   Procedures: TTE Study Conclusions  - Left ventricle: The cavity size was normal. There was mild concentric hypertrophy. Systolic function was normal. The estimated ejection fraction was in the range of 55% to 60%. Wall motion was normal; there were no regional wall motion abnormalities. Left  ventricular diastolic function parameters were normal. - Atrial septum: No defect or patent foramen ovale was identified.  Doppler Carotid Summary: No significant extracranial carotid artery stenosis demonstrated. Vertebrals are patent with antegrade flow.  Consultations:  Neurology  Discharge Exam: Filed Vitals:   07/12/12 0341 07/12/12 0628 07/12/12 1221 07/12/12 1401  BP: 96/66 110/60 108/55 122/68  Pulse: 40 65 82 61  Temp: 98 F (36.7 C) 98.1 F (36.7 C) 97.9 F (36.6 C) 97.5 F (36.4 C)  TempSrc: Oral Oral    Resp: 16 17 20 20   Height:      Weight:      SpO2: 93% 99% 98% 96%    General: He is in no distress  Cardiovascular: His heart is regular without murmurs rubs or gallops  Respiratory: The lungs are clear to auscultation bilaterally  Abdomen: Soft non-tender  Neuro exam: Cranial nerves II through XII intact. Muscle strength 5-/5 left hand, 5/5 throughout. Sensation decreased over the left hand, subjectively improved since yesterday. DTRs normal.  Discharge Instructions     Medication List     As of 07/13/2012  2:47 PM    STOP taking these medications         aspirin 325 MG EC tablet      TAKE these medications         clopidogrel 75 MG tablet   Commonly known as: PLAVIX   Take 1 tablet (75 mg total) by mouth daily.      ezetimibe-simvastatin 10-10 MG per tablet   Commonly known as: VYTORIN   Take 1 tablet by mouth every morning.      fenofibrate 48 MG  tablet   Commonly known as: TRICOR   Take 48 mg by mouth every morning.      lisinopril 10 MG tablet   Commonly known as: PRINIVIL,ZESTRIL   Take 10 mg by mouth every morning.      zolpidem 5 MG tablet   Commonly known as: AMBIEN   Take 10 mg by mouth at bedtime as needed. Sleep             Follow-up Information    Follow up with [name], MD. Schedule an appointment as soon as possible for a visit in 2 months. (stroke clinic)    Contact information:   [address],  [address] [facility] [place] [address] [phone]       Follow up with BOUSKA,DAVID E, MD. In 1 week.   Contact information:   [address] [place] [address] [phone]           The results of significant diagnostics from this hospitalization (including imaging, microbiology, ancillary and laboratory) are listed below for reference.    Significant Diagnostic Studies: Mr Brain Wo Contrast  07/10/2012  *RADIOLOGY REPORT*  Clinical Data: Left hand weakness beginning this morning.  History of hyperlipidemia.  History of prior smoking.  History of hypertension.  History of prior cerebral infarction.  MRI HEAD WITHOUT CONTRAST  Technique:  Multiplanar, multiecho pulse sequences of the brain and surrounding structures were obtained according to standard protocol without intravenous contrast.  Comparison: 03/07/2010.  Findings: There is a 5 x 10 mm acute right posterior frontal cortical and subcortical infarction without hemorrhage or mass effect.  This is on the opposite side of the previous left hemisphere acute infarction demonstrated in 2011.  There is no hydrocephalus or extra-axial fluid.  There is moderately advanced atrophy for the patient's chronologic age of 54.  There is extensive chronic microvascular ischemic change in the periventricular and subcortical white matter, likely secondary hypertension.  No foci of chronic hemorrhage are definitely seen.  Numerous remote lacunar infarcts affect the cerebral hemispheres most notable in the left periventricular region.  The pituitary and cerebellar tonsils are unremarkable.  There are no worrisome osseous lesions.  Flow voids are maintained in the carotid, basilar, and vertebral arteries suggesting patency.  There are no acute orbital, sinus, or mastoid findings.  Compared with priors, there is slight progression of atrophy and chronic microvascular ischemic change.  IMPRESSION: Acute 5 x 10 mm right  posterior frontal cortical and subcortical infarction without hemorrhage or mass effect.  Advanced atrophy and chronic microvascular ischemic change, with slight progression from 2011.   Original Report Authenticated By: Davonna Belling, M.D.     Labs: Basic Metabolic Panel:  Lab 07/11/12 5784 07/10/12 1651  NA -- 138  K -- 4.6  CL -- 104  CO2 -- 25  GLUCOSE -- 104*  BUN -- 47*  CREATININE 2.31* 2.40*  CALCIUM -- 9.3  MG -- --  PHOS -- --   Liver Function Tests:  Lab 07/10/12 1651  AST 12  ALT 8  ALKPHOS 35*  BILITOT 0.2*  PROT 6.6  ALBUMIN 3.7   CBC:  Lab 07/11/12 0101 07/10/12 1651  WBC 5.2 5.3  NEUTROABS -- 3.0  HGB 11.1* 11.2*  HCT 34.4* 35.4*  MCV 93.7 96.5  PLT 183 181   Cardiac Enzymes:  Lab 07/10/12 1651  CKTOTAL --  CKMB --  CKMBINDEX --  TROPONINI <0.30   BNP: BNP (last 3 results) No results found for  this basename: PROBNP:3 in the last 8760 hours CBG:  Lab 07/10/12 1709  GLUCAP 90   Signed:  Gahel Safley  [facility] 07/13/2012, 2:47 PM

## 2012-07-21 ENCOUNTER — Encounter: Payer: Self-pay | Admitting: Gastroenterology

## 2012-07-29 ENCOUNTER — Encounter: Payer: Self-pay | Admitting: Gastroenterology

## 2012-08-18 ENCOUNTER — Ambulatory Visit (INDEPENDENT_AMBULATORY_CARE_PROVIDER_SITE_OTHER): Payer: Medicare Other | Admitting: Gastroenterology

## 2012-08-18 ENCOUNTER — Telehealth: Payer: Self-pay | Admitting: *Deleted

## 2012-08-18 ENCOUNTER — Encounter: Payer: Self-pay | Admitting: Gastroenterology

## 2012-08-18 VITALS — BP 132/68 | HR 58 | Ht 73.0 in | Wt 220.0 lb

## 2012-08-18 DIAGNOSIS — I6350 Cerebral infarction due to unspecified occlusion or stenosis of unspecified cerebral artery: Secondary | ICD-10-CM

## 2012-08-18 DIAGNOSIS — Z1211 Encounter for screening for malignant neoplasm of colon: Secondary | ICD-10-CM

## 2012-08-18 MED ORDER — NA SULFATE-K SULFATE-MG SULF 17.5-3.13-1.6 GM/177ML PO SOLN: 1.0000 | Freq: Once | ORAL | Status: DC

## 2012-08-18 NOTE — Telephone Encounter (Signed)
[facility]   08/18/2012    RE: Jason [name] DOB: 05-Jan-1936 MRN: [id number]   Dear  Pearlean Brownie   We have scheduled the above patient for an endoscopic procedure. Our records show that he is on anticoagulation therapy.   Please advise as to how long the patient may come off his therapy ofPlavix prior to the procedure, which is scheduled for 09/07/2012.  Please fax back/ or route the completed form to Donivin Wirt at [phone].   Sincerely,  Merri Ray

## 2012-08-18 NOTE — Progress Notes (Signed)
History of Present Illness: A pleasant 77 year old white male here to sit up colonoscopy. He has no GI complaints including change of bowel habits, abdominal pain, melena or hematochezia. Last colonoscopy was 10 years ago. He has a history of CVA, most recently approximately 2 months ago. He was switched from aspirin to Plavix. He's had no neurological complaints since that time.    Past Medical History  Diagnosis Date  . Allergic rhinitis, cause unspecified   . Headache   . Pure hyperglyceridemia   . Unspecified transient cerebral ischemia 02/2010  . Chronic kidney disease   . Organic insomnia, unspecified   . GERD (gastroesophageal reflux disease)   . Recurrent upper respiratory infection (URI)     states chest congestion x 3-4 weeks, no fever- states was productive small amt yellow phlegm, now is clear  . Hypertension     stress test 5 years ago, eccho 9/11 on chart, Dr. Brown Human  . Stroke     left basal ganglia infarction 9/11- LOV DR Pearlean Brownie 7/12 on chart  . Unspecified cerebral artery occlusion with cerebral infarction    Past Surgical History  Procedure Laterality Date  . Knee surgery      x2  . Joint replacement      right total knee replacement x 2  . Tonsillectomy    . Umbilical hernia repair  05/21/2011    Procedure: HERNIA REPAIR UMBILICAL ADULT;  Surgeon: Shelly Rubenstein, MD;  Location: [facility] ORS;  Service: General;  Laterality: N/A;  . Cholecystectomy  06/17/2012    Procedure: LAPAROSCOPIC CHOLECYSTECTOMY;  Surgeon: Axel Filler, MD;  Location: [facility] OR;  Service: General;  Laterality: N/A;   family history is negative for Colon cancer. Current Outpatient Prescriptions  Medication Sig Dispense Refill  . clopidogrel (PLAVIX) 75 MG tablet Take 1 tablet (75 mg total) by mouth daily.  90 tablet  1  . darifenacin (ENABLEX) 7.5 MG 24 hr tablet Take 7.5 mg by mouth daily.      Marland Kitchen ezetimibe-simvastatin (VYTORIN) 10-10 MG per tablet Take 1 tablet by mouth every morning.       .  fenofibrate (TRICOR) 48 MG tablet Take 48 mg by mouth every morning.       Marland Kitchen lisinopril (PRINIVIL,ZESTRIL) 10 MG tablet Take 10 mg by mouth every morning.       . zolpidem (AMBIEN) 5 MG tablet Take 10 mg by mouth at bedtime as needed. Sleep        No current facility-administered medications for this visit.   Allergies as of 08/18/2012  . (No Known Allergies)    reports that he has quit smoking. He quit smokeless tobacco use about 39 years ago. He reports that he drinks about 3.6 ounces of alcohol per week. He reports that he does not use illicit drugs.     Review of Systems: He complains of nocturia 5-6 times a night. He's taken enablex without improvement. Pertinent positive and negative review of systems were noted in the above HPI section. All other review of systems were otherwise negative.  Vital signs were reviewed in today's medical record Physical Exam: General: Well developed , well nourished, no acute distress Skin: anicteric Head: Normocephalic and atraumatic Eyes:  sclerae anicteric, EOMI Ears: Normal auditory acuity Mouth: No deformity or lesions Neck: Supple, no masses or thyromegaly Lungs: Clear throughout to auscultation Heart: Regular rate and rhythm; no murmurs, rubs or bruits Abdomen: Soft, non tender and non distended. No masses, hepatosplenomegaly or hernias noted. Normal Bowel sounds  Rectal:deferred Musculoskeletal: Symmetrical with no gross deformities  Skin: No lesions on visible extremities Pulses:  Normal pulses noted Extremities: No clubbing, cyanosis, edema or deformities noted Neurological: Alert oriented x 4, grossly nonfocal Cervical Nodes:  No significant cervical adenopathy Inguinal Nodes: No significant inguinal adenopathy Psychological:  Alert and cooperative. Normal mood and affect      And

## 2012-08-18 NOTE — Patient Instructions (Addendum)
You will be contacted by our office prior to your procedure for directions on holding your Plavix.  If you do not hear from our office 1 week prior to your scheduled procedure, please call [phone] to discuss.   You have been scheduled for a colonoscopy with propofol. Please follow written instructions given to you at your visit today.  Please pick up your prep kit at the pharmacy within the next 1-3 days. If you use inhalers (even only as needed) or a CPAP machine, please bring them with you on the day of your procedure.

## 2012-08-18 NOTE — Assessment & Plan Note (Addendum)
Plan screening colonoscopy. Will check with PCP regarding holding plavix

## 2012-08-18 NOTE — Assessment & Plan Note (Signed)
The patient is on Plavix. I will check with his neurologist whether this can be held prior to colonoscopy.

## 2012-08-26 NOTE — Telephone Encounter (Signed)
dR kAPLAN THIS PT IS ONLY HOLDING PLAVIX 3 DAYS  FYI  PT AWARE

## 2012-09-02 ENCOUNTER — Telehealth: Payer: Self-pay | Admitting: *Deleted

## 2012-09-02 NOTE — Telephone Encounter (Signed)
Left message for him to call me back to get his procedure rescheduled  And to continue his coumadin for now

## 2012-09-02 NOTE — Telephone Encounter (Signed)
Jason [name], H      His heart might be OK but neurologically elective procedures should not be done for three months after a CVA      Sorry,      [name]         ----- Message -----   From: Marlowe Kays, CMA   Sent: 09/01/2012 8:52 AM   To: Cathlyn Parsons, CRNA   Subject: RE: CVA patient       Jason [name], He was cleared by his cardiologist.... Is it still not OK??   ----- Message -----   From: Cathlyn Parsons, CRNA   Sent: 08/30/2012 10:10 AM   To: Louis Meckel, MD, Marlowe Kays, CMA   Subject: CVA patient       Doc,      This patient suffered his CVA on 07/10/2012. By guideline we can do his screening 3 mos after this incident. Can we reschedule his screening colonoscopy after 10/08/2012?      Thanks,      Jason [name]

## 2012-09-02 NOTE — Telephone Encounter (Signed)
Message copied by Marlowe Kays on Thu Sep 02, 2012  8:56 AM ------      Message from: Cathlyn Parsons      Created: Mon Aug 30, 2012 10:10 AM      Regarding: CVA patient       Doc,            This patient suffered his CVA on 07/10/2012.  By guideline we can do his screening 3 mos after this incident.  Can we reschedule his screening colonoscopy after 10/08/2012?            Thanks,            [name] ------

## 2012-09-03 NOTE — Telephone Encounter (Signed)
PATIENT HAS BEEN RESCHEDULED TO 10/19/2012 AT 11:30AM  PT AWARE

## 2012-09-07 ENCOUNTER — Encounter: Payer: Medicare Other | Admitting: Gastroenterology

## 2012-10-19 ENCOUNTER — Ambulatory Visit (AMBULATORY_SURGERY_CENTER): Payer: Medicare Other | Admitting: Gastroenterology

## 2012-10-19 ENCOUNTER — Encounter: Payer: Self-pay | Admitting: Gastroenterology

## 2012-10-19 VITALS — BP 122/70 | HR 60 | Temp 96.3°F | Resp 16 | Ht 73.0 in | Wt 220.0 lb

## 2012-10-19 DIAGNOSIS — K5730 Diverticulosis of large intestine without perforation or abscess without bleeding: Secondary | ICD-10-CM

## 2012-10-19 DIAGNOSIS — D126 Benign neoplasm of colon, unspecified: Secondary | ICD-10-CM

## 2012-10-19 DIAGNOSIS — Z1211 Encounter for screening for malignant neoplasm of colon: Secondary | ICD-10-CM

## 2012-10-19 MED ORDER — SODIUM CHLORIDE 0.9 % IV SOLN
500.0000 mL | INTRAVENOUS | Status: DC
Start: 2012-10-19 — End: 2012-10-20

## 2012-10-19 NOTE — Progress Notes (Signed)
Called to room to assist during endoscopic procedure.  Patient ID and intended procedure confirmed with present staff. Received instructions for my participation in the procedure from the performing physician.  

## 2012-10-19 NOTE — Patient Instructions (Addendum)
Resume plavix in am   YOU HAD AN ENDOSCOPIC PROCEDURE TODAY AT THE [facility]: Refer to the procedure report that was given to you for any specific questions about what was found during the examination.  If the procedure report does not answer your questions, please call your gastroenterologist to clarify.  If you requested that your care partner not be given the details of your procedure findings, then the procedure report has been included in a sealed envelope for you to review at your convenience later.  YOU SHOULD EXPECT: Some feelings of bloating in the abdomen. Passage of more gas than usual.  Walking can help get rid of the air that was put into your GI tract during the procedure and reduce the bloating. If you had a lower endoscopy (such as a colonoscopy or flexible sigmoidoscopy) you may notice spotting of blood in your stool or on the toilet paper. If you underwent a bowel prep for your procedure, then you may not have a normal bowel movement for a few days.  DIET: Your first meal following the procedure should be a light meal and then it is ok to progress to your normal diet.  A half-sandwich or bowl of soup is an example of a good first meal.  Heavy or fried foods are harder to digest and may make you feel nauseous or bloated.  Likewise meals heavy in dairy and vegetables can cause extra gas to form and this can also increase the bloating.  Drink plenty of fluids but you should avoid alcoholic beverages for 24 hours.  ACTIVITY: Your care partner should take you home directly after the procedure.  You should plan to take it easy, moving slowly for the rest of the day.  You can resume normal activity the day after the procedure however you should NOT DRIVE or use heavy machinery for 24 hours (because of the sedation medicines used during the test).    SYMPTOMS TO REPORT IMMEDIATELY: A gastroenterologist can be reached at any hour.  During normal business hours, 8:30 AM to 5:00 PM  Monday through Friday, call [phone].  After hours and on weekends, please call the GI answering service at [phone] who will take a message and have the physician on call contact you.   Following lower endoscopy (colonoscopy or flexible sigmoidoscopy):  Excessive amounts of blood in the stool  Significant tenderness or worsening of abdominal pains  Swelling of the abdomen that is new, acute  Fever of 100F or higher  Following upper endoscopy (EGD)  Vomiting of blood or coffee ground material  New chest pain or pain under the shoulder blades  Painful or persistently difficult swallowing  New shortness of breath  Fever of 100F or higher  Black, tarry-looking stools  FOLLOW UP: If any biopsies were taken you will be contacted by phone or by letter within the next 1-3 weeks.  Call your gastroenterologist if you have not heard about the biopsies in 3 weeks.  Our staff will call the home number listed on your records the next business day following your procedure to check on you and address any questions or concerns that you may have at that time regarding the information given to you following your procedure. This is a courtesy call and so if there is no answer at the home number and we have not heard from you through the emergency physician on call, we will assume that you have returned to your regular daily activities without incident.  SIGNATURES/CONFIDENTIALITY: You and/or your care partner have signed paperwork which will be entered into your electronic medical record.  These signatures attest to the fact that that the information above on your After Visit Summary has been reviewed and is understood.  Full responsibility of the confidentiality of this discharge information lies with you and/or your care-partner.

## 2012-10-19 NOTE — Op Note (Addendum)
[facility] [address] [place], [address]   COLONOSCOPY PROCEDURE REPORT  PATIENT: Jason [name], Jason [name]  MR#: [id number] BIRTHDATE: 1935/12/18 , 77  yrs. old GENDER: Male ENDOSCOPIST: Louis Meckel, MD REFERRED XB:JYNWG Bouska, M.D. PROCEDURE DATE:  10/19/2012 PROCEDURE:   Colonoscopy with snare polypectomy and Colonoscopy with cold biopsy polypectomy ASA CLASS:   Class II INDICATIONS:Average risk patient for colon cancer. MEDICATIONS: MAC sedation, administered by CRNA and propofol (Diprivan) 200mg  IV  DESCRIPTION OF PROCEDURE:   After the risks benefits and alternatives of the procedure were thoroughly explained, informed consent was obtained.  A digital rectal exam revealed no abnormalities of the rectum.   The LB CF-H180AL [id number]  endoscope was introduced through the anus and advanced to the cecum, which was identified by both the appendix and ileocecal valve. No adverse events experienced.   The quality of the prep was excellent using Suprep  The instrument was then slowly withdrawn as the colon was fully examined.      COLON FINDINGS: A flat polyp measuring approximately 8 mm was found in the transverse colon.  A polypectomy was performed with a cold snare.  The resection was complete and the polyp tissue was completely retrieved.   A sessile polyp measuring 2 mm in size was found in the descending colon.  A polypectomy was performed with cold forceps.   Mild diverticulosis was noted in the sigmoid colon. The colon mucosa was otherwise normal.  Retroflexed views revealed no abnormalities. The time to cecum=2 minutes 38 seconds. Withdrawal time=7 minutes 35 seconds.  The scope was withdrawn and the procedure completed. COMPLICATIONS: There were no complications.  ENDOSCOPIC IMPRESSION: 1.   Flat polyp was found in the transverse colon; polypectomy was performed with a cold snare 2.   Sessile polyp measuring 2 mm in size was found in  the descending colon; polypectomy was performed with cold forceps 3.   Mild diverticulosis was noted in the sigmoid colon 4.   The colon mucosa was otherwise normal  RECOMMENDATIONS: If the polyp(s) removed today are proven to be adenomatous (pre-cancerous) polyps, you will need a repeat colonoscopy in 5 years.  Otherwise you should continue to follow colorectal cancer screening guidelines for "routine risk" patients with colonoscopy in 10 years.  You will receive a letter within 1-2 weeks with the results of your biopsy as well as final recommendations.  Please call my office if you have not received a letter after 3 weeks. Resume Plavix in am   eSigned:  Louis Meckel, MD 10/29/2012 4:13 PM Revised: 10/29/2012 4:13 PM  cc:   PATIENT NAME:  [name], [name] MR#: [id number]

## 2012-10-19 NOTE — Progress Notes (Signed)
Lidocaine-40mg IV prior to Propofol InductionPropofol given over incremental dosages 

## 2012-10-19 NOTE — Progress Notes (Signed)
Patient did not experience any of the following events: a burn prior to discharge; a fall within the facility; wrong site/side/patient/procedure/implant event; or a hospital transfer or hospital admission upon discharge from the facility. (G8907) Patient did not have preoperative order for IV antibiotic SSI prophylaxis. (G8918)  

## 2012-10-20 ENCOUNTER — Telehealth: Payer: Self-pay | Admitting: *Deleted

## 2012-10-20 NOTE — Telephone Encounter (Signed)
  Follow up Call-  Call back number 10/19/2012  Post procedure Call Back phone  # [phone]  Permission to leave phone message Yes     Patient questions:  Do you have a fever, pain , or abdominal swelling? no Pain Score  0 *  Have you tolerated food without any problems? yes  Have you been able to return to your normal activities? yes  Do you have any questions about your discharge instructions: Diet   no Medications  no Follow up visit  no  Do you have questions or concerns about your Care? no  Actions: * If pain score is 4 or above: No action needed, pain <4.

## 2012-10-29 ENCOUNTER — Encounter: Payer: Self-pay | Admitting: Gastroenterology

## 2013-07-20 ENCOUNTER — Other Ambulatory Visit: Payer: Self-pay | Admitting: Orthopedic Surgery

## 2013-07-21 ENCOUNTER — Encounter (HOSPITAL_BASED_OUTPATIENT_CLINIC_OR_DEPARTMENT_OTHER): Payer: Self-pay | Admitting: *Deleted

## 2013-07-21 NOTE — Progress Notes (Signed)
Pt had a cva 1/14-no residual plavix- Off all htn meds-no labs needed

## 2013-07-26 NOTE — H&P (Signed)
Jason [name] is an 78 y.o. male.    Chief Complaint: Left Knee Pain with Recurrent Effusion  PJA:[id number] comes in today for pain in his left knee and reports that his revision right total knee is doing well.  The pain in his left knee is more along the lateral side where he has some known arthritic changes and peripheral spurring and he said shouldn't possibly having a cortisone injection today.  The pain is causing him to limp.  He has not had any falling episodes but does occasionally wake him at night.  Past Medical History  Diagnosis Date  . Allergic rhinitis, cause unspecified   . Headache(784.0)   . Pure hyperglyceridemia   . Unspecified transient cerebral ischemia 02/2010  . Chronic kidney disease   . Organic insomnia, unspecified   . GERD (gastroesophageal reflux disease)   . Recurrent upper respiratory infection (URI)     states chest congestion x 3-4 weeks, no fever- states was productive small amt yellow phlegm, now is clear  . Stroke     left basal ganglia infarction 9/11- LOV DR [name] 7/12 on chart  . Unspecified cerebral artery occlusion with cerebral infarction   . Hypertension     stress test 5 years ago, eccho 9/11 on chart, Dr. Rayann Heman    Past Surgical History  Procedure Laterality Date  . Knee surgery      x2-right  . Tonsillectomy    . Umbilical hernia repair  05/21/2011    Procedure: HERNIA REPAIR UMBILICAL ADULT;  Surgeon: Harl Bowie, MD;  Location: [facility];  Service: General;  Laterality: N/A;  . Cholecystectomy  06/17/2012    Procedure: LAPAROSCOPIC CHOLECYSTECTOMY;  Surgeon: Ralene Ok, MD;  Location: [place];  Service: General;  Laterality: N/A;  . Joint replacement  2008    right total knee replacement x 2  . Colonoscopy      Family History  Problem Relation Age of Onset  . Colon cancer Neg Hx    Social History:  reports that he quit smoking about 40 years ago. He quit smokeless tobacco use about 40 years ago. He reports that he drinks  about 3.6 ounces of alcohol per week. He reports that he does not use illicit drugs.  Allergies: No Known Allergies  No prescriptions prior to admission    No results found for this or any previous visit (from the past 48 hour(s)). No results found.  Review of Systems  Constitutional: Negative.   HENT: Negative.   Eyes: Negative.   Respiratory: Negative.   Cardiovascular: Negative.   Gastrointestinal: Negative.   Genitourinary: Negative.   Musculoskeletal: Positive for joint pain.  Skin: Negative.   Neurological: Negative.   Psychiatric/Behavioral: Negative.     There were no vitals taken for this visit. Physical Exam  Constitutional: He is oriented to person, place, and time. He appears well-developed and well-nourished.  HENT:  Head: Normocephalic and atraumatic.  Eyes: Pupils are equal, round, and reactive to light.  Neck: Normal range of motion. Neck supple.  Cardiovascular: Intact distal pulses.   Respiratory: Effort normal.  Musculoskeletal: He exhibits tenderness.  The left knee has a 2-3+ effusion, no erythema and no increased warmth.  There is crepitus as you taken through range of motion  Neurological: He is alert and oriented to person, place, and time.  Skin: Skin is warm and dry.  Psychiatric: He has a normal mood and affect. His behavior is normal. Judgment and thought content normal.  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  Sooner if he has any difficulties.  He did not require any pain medication today.patient is made aware the benefits risks and potential complications of knee arthroscopy.  He wishes to proceed with the surgery.  Krystopher Kuenzel R 07/26/2013, 11:42 AM

## 2013-07-27 ENCOUNTER — Ambulatory Visit (HOSPITAL_BASED_OUTPATIENT_CLINIC_OR_DEPARTMENT_OTHER)
Admission: RE | Admit: 2013-07-27 | Discharge: 2013-07-27 | Disposition: A | Discharge status: Home / Self Care | Payer: Medicare Other | Source: Ambulatory Visit | Attending: Orthopedic Surgery | Admitting: Orthopedic Surgery

## 2013-07-27 ENCOUNTER — Encounter (HOSPITAL_BASED_OUTPATIENT_CLINIC_OR_DEPARTMENT_OTHER)
Admission: RE | Disposition: A | Discharge status: Home / Self Care | Payer: Self-pay | Source: Ambulatory Visit | Attending: Orthopedic Surgery

## 2013-07-27 ENCOUNTER — Encounter (HOSPITAL_BASED_OUTPATIENT_CLINIC_OR_DEPARTMENT_OTHER): Payer: Medicare Other | Admitting: Anesthesiology

## 2013-07-27 ENCOUNTER — Ambulatory Visit (HOSPITAL_BASED_OUTPATIENT_CLINIC_OR_DEPARTMENT_OTHER): Payer: Medicare Other | Admitting: Anesthesiology

## 2013-07-27 ENCOUNTER — Encounter (HOSPITAL_BASED_OUTPATIENT_CLINIC_OR_DEPARTMENT_OTHER): Payer: Self-pay | Admitting: *Deleted

## 2013-07-27 DIAGNOSIS — M23329 Other meniscus derangements, posterior horn of medial meniscus, unspecified knee: Secondary | ICD-10-CM | POA: Insufficient documentation

## 2013-07-27 DIAGNOSIS — Z96659 Presence of unspecified artificial knee joint: Secondary | ICD-10-CM | POA: Insufficient documentation

## 2013-07-27 DIAGNOSIS — M2240 Chondromalacia patellae, unspecified knee: Secondary | ICD-10-CM | POA: Insufficient documentation

## 2013-07-27 DIAGNOSIS — K219 Gastro-esophageal reflux disease without esophagitis: Secondary | ICD-10-CM | POA: Insufficient documentation

## 2013-07-27 DIAGNOSIS — I739 Peripheral vascular disease, unspecified: Secondary | ICD-10-CM | POA: Insufficient documentation

## 2013-07-27 DIAGNOSIS — S83289A Other tear of lateral meniscus, current injury, unspecified knee, initial encounter: Secondary | ICD-10-CM

## 2013-07-27 DIAGNOSIS — I129 Hypertensive chronic kidney disease with stage 1 through stage 4 chronic kidney disease, or unspecified chronic kidney disease: Secondary | ICD-10-CM | POA: Insufficient documentation

## 2013-07-27 DIAGNOSIS — M2340 Loose body in knee, unspecified knee: Secondary | ICD-10-CM | POA: Insufficient documentation

## 2013-07-27 DIAGNOSIS — Z8673 Personal history of transient ischemic attack (TIA), and cerebral infarction without residual deficits: Secondary | ICD-10-CM | POA: Insufficient documentation

## 2013-07-27 DIAGNOSIS — N189 Chronic kidney disease, unspecified: Secondary | ICD-10-CM | POA: Insufficient documentation

## 2013-07-27 DIAGNOSIS — Z87891 Personal history of nicotine dependence: Secondary | ICD-10-CM | POA: Insufficient documentation

## 2013-07-27 DIAGNOSIS — M23302 Other meniscus derangements, unspecified lateral meniscus, unspecified knee: Secondary | ICD-10-CM | POA: Insufficient documentation

## 2013-07-27 HISTORY — PX: KNEE ARTHROSCOPY: SHX127

## 2013-07-27 LAB — POCT HEMOGLOBIN-HEMACUE: HEMOGLOBIN: 15.4 g/dL (ref 13.0–17.0)

## 2013-07-27 SURGERY — ARTHROSCOPY, KNEE
Anesthesia: General | Site: Knee | Laterality: Left

## 2013-07-27 MED ORDER — BUPIVACAINE-EPINEPHRINE 0.5% -1:200000 IJ SOLN
INTRAMUSCULAR | Status: DC | PRN
  Administered 2013-07-27: 20 mL

## 2013-07-27 MED ORDER — CEFAZOLIN SODIUM 1-5 GM-% IV SOLN
INTRAVENOUS | Status: AC
  Filled 2013-07-27: qty 100

## 2013-07-27 MED ORDER — BUPIVACAINE HCL (PF) 0.5 % IJ SOLN
INTRAMUSCULAR | Status: AC
  Filled 2013-07-27: qty 30

## 2013-07-27 MED ORDER — ONDANSETRON HCL 4 MG/2ML IJ SOLN
INTRAMUSCULAR | Status: DC | PRN
  Administered 2013-07-27: 4 mg via INTRAVENOUS

## 2013-07-27 MED ORDER — DEXAMETHASONE SODIUM PHOSPHATE 4 MG/ML IJ SOLN
INTRAMUSCULAR | Status: DC | PRN
  Administered 2013-07-27: 5 mg via INTRAVENOUS

## 2013-07-27 MED ORDER — CHLORHEXIDINE GLUCONATE 4 % EX LIQD: 60.0000 mL | Freq: Once | CUTANEOUS | Status: DC

## 2013-07-27 MED ORDER — PROPOFOL 10 MG/ML IV BOLUS
INTRAVENOUS | Status: DC | PRN
  Administered 2013-07-27: 40 mg via INTRAVENOUS
  Administered 2013-07-27: 140 mg via INTRAVENOUS

## 2013-07-27 MED ORDER — FENTANYL CITRATE 0.05 MG/ML IJ SOLN
INTRAMUSCULAR | Status: DC | PRN
  Administered 2013-07-27 (×2): 50 ug via INTRAVENOUS

## 2013-07-27 MED ORDER — LIDOCAINE HCL (CARDIAC) 20 MG/ML IV SOLN
INTRAVENOUS | Status: DC | PRN
  Administered 2013-07-27: 60 mg via INTRAVENOUS

## 2013-07-27 MED ORDER — OXYCODONE HCL 5 MG/5ML PO SOLN: 5.0000 mg | Freq: Once | ORAL | Status: DC | PRN

## 2013-07-27 MED ORDER — MIDAZOLAM HCL 2 MG/2ML IJ SOLN: 1.0000 mg | INTRAMUSCULAR | Status: DC | PRN

## 2013-07-27 MED ORDER — SODIUM CHLORIDE 0.9 % IR SOLN
Status: DC | PRN
  Administered 2013-07-27: 6000 mL

## 2013-07-27 MED ORDER — DEXTROSE-NACL 5-0.45 % IV SOLN
INTRAVENOUS | Status: DC
Start: 2013-07-27 — End: 2013-07-27

## 2013-07-27 MED ORDER — HYDROCODONE-ACETAMINOPHEN 5-325 MG PO TABS: 1.0000 | ORAL_TABLET | Freq: Four times a day (QID) | ORAL | Status: DC | PRN

## 2013-07-27 MED ORDER — EPINEPHRINE HCL 1 MG/ML IJ SOLN
INTRAMUSCULAR | Status: AC
  Filled 2013-07-27: qty 1

## 2013-07-27 MED ORDER — HYDROMORPHONE HCL PF 1 MG/ML IJ SOLN
INTRAMUSCULAR | Status: AC
  Filled 2013-07-27: qty 1

## 2013-07-27 MED ORDER — FENTANYL CITRATE 0.05 MG/ML IJ SOLN: 50.0000 ug | INTRAMUSCULAR | Status: DC | PRN

## 2013-07-27 MED ORDER — EPINEPHRINE HCL 1 MG/ML IJ SOLN
INTRAMUSCULAR | Status: DC | PRN
Start: 2013-07-27 — End: 2013-07-27
  Administered 2013-07-27: 1 mg

## 2013-07-27 MED ORDER — LIDOCAINE HCL (PF) 1 % IJ SOLN
INTRAMUSCULAR | Status: AC
  Filled 2013-07-27: qty 30

## 2013-07-27 MED ORDER — BUPIVACAINE-EPINEPHRINE PF 0.5-1:200000 % IJ SOLN
INTRAMUSCULAR | Status: AC
  Filled 2013-07-27: qty 30

## 2013-07-27 MED ORDER — OXYCODONE HCL 5 MG PO TABS: 5.0000 mg | ORAL_TABLET | Freq: Once | ORAL | Status: DC | PRN

## 2013-07-27 MED ORDER — FENTANYL CITRATE 0.05 MG/ML IJ SOLN
INTRAMUSCULAR | Status: AC
  Filled 2013-07-27: qty 4

## 2013-07-27 MED ORDER — CEFAZOLIN SODIUM-DEXTROSE 2-3 GM-% IV SOLR
2.0000 g | INTRAVENOUS | Status: AC
  Administered 2013-07-27: 2 g via INTRAVENOUS

## 2013-07-27 MED ORDER — HYDROMORPHONE HCL PF 1 MG/ML IJ SOLN
0.2500 mg | INTRAMUSCULAR | Status: DC | PRN
  Administered 2013-07-27 (×2): 0.5 mg via INTRAVENOUS

## 2013-07-27 MED ORDER — LACTATED RINGERS IV SOLN
INTRAVENOUS | Status: DC
  Administered 2013-07-27 (×2): via INTRAVENOUS

## 2013-07-27 MED ORDER — EPHEDRINE SULFATE 50 MG/ML IJ SOLN
INTRAMUSCULAR | Status: DC | PRN
  Administered 2013-07-27: 10 mg via INTRAVENOUS

## 2013-07-27 SURGICAL SUPPLY — 45 items
BANDAGE ELASTIC 6 VELCRO ST LF (GAUZE/BANDAGES/DRESSINGS) ×3 IMPLANT
BLADE 4.2CUDA (BLADE) IMPLANT
BLADE CUTTER GATOR 3.5 (BLADE) ×2 IMPLANT
BLADE GREAT WHITE 4.2 (BLADE) IMPLANT
BLADE GREAT WHITE 4.2MM (BLADE)
CANISTER SUCT 3000ML (MISCELLANEOUS) IMPLANT
DRAPE ARTHROSCOPY W/POUCH 114 (DRAPES) ×3 IMPLANT
DURAPREP 26ML APPLICATOR (WOUND CARE) ×3 IMPLANT
ELECT MENISCUS 165MM 90D (ELECTRODE) IMPLANT
ELECT REM PT RETURN 9FT ADLT (ELECTROSURGICAL)
ELECTRODE REM PT RTRN 9FT ADLT (ELECTROSURGICAL) IMPLANT
GAUZE XEROFORM 1X8 LF (GAUZE/BANDAGES/DRESSINGS) ×3 IMPLANT
GLOVE BIO SURGEON STRL SZ 6.5 (GLOVE) ×1 IMPLANT
GLOVE BIO SURGEON STRL SZ7.5 (GLOVE) ×3 IMPLANT
GLOVE BIO SURGEON STRL SZ8.5 (GLOVE) ×3 IMPLANT
GLOVE BIO SURGEONS STRL SZ 6.5 (GLOVE) ×1
GLOVE BIOGEL PI IND STRL 7.0 (GLOVE) IMPLANT
GLOVE BIOGEL PI IND STRL 8 (GLOVE) ×1 IMPLANT
GLOVE BIOGEL PI IND STRL 9 (GLOVE) ×1 IMPLANT
GLOVE BIOGEL PI INDICATOR 7.0 (GLOVE) ×2
GLOVE BIOGEL PI INDICATOR 8 (GLOVE) ×2
GLOVE BIOGEL PI INDICATOR 9 (GLOVE) ×2
GOWN STRL REUS W/ TWL LRG LVL3 (GOWN DISPOSABLE) ×1 IMPLANT
GOWN STRL REUS W/ TWL LRG LVL4 (GOWN DISPOSABLE) ×1 IMPLANT
GOWN STRL REUS W/TWL LRG LVL3 (GOWN DISPOSABLE) ×3
GOWN STRL REUS W/TWL LRG LVL4 (GOWN DISPOSABLE) ×3
GOWN STRL REUS W/TWL XL LVL4 (GOWN DISPOSABLE) ×3 IMPLANT
IV NS IRRIG 3000ML ARTHROMATIC (IV SOLUTION) ×5 IMPLANT
KNEE WRAP E Z 3 GEL PACK (MISCELLANEOUS) ×3 IMPLANT
MANIFOLD NEPTUNE II (INSTRUMENTS) ×2 IMPLANT
NDL SAFETY ECLIPSE 18X1.5 (NEEDLE) ×1 IMPLANT
NEEDLE HYPO 18GX1.5 SHARP (NEEDLE) ×3
PACK ARTHROSCOPY DSU (CUSTOM PROCEDURE TRAY) ×3 IMPLANT
PACK BASIN DAY SURGERY FS (CUSTOM PROCEDURE TRAY) ×3 IMPLANT
PAD ALCOHOL SWAB (MISCELLANEOUS) ×5 IMPLANT
PENCIL BUTTON HOLSTER BLD 10FT (ELECTRODE) IMPLANT
SET ARTHROSCOPY TUBING (MISCELLANEOUS) ×3
SET ARTHROSCOPY TUBING LN (MISCELLANEOUS) ×1 IMPLANT
SLEEVE SCD COMPRESS KNEE MED (MISCELLANEOUS) IMPLANT
SPONGE GAUZE 4X4 12PLY (GAUZE/BANDAGES/DRESSINGS) ×3 IMPLANT
SYR 3ML 18GX1 1/2 (SYRINGE) IMPLANT
SYR 5ML LL (SYRINGE) ×3 IMPLANT
TOWEL OR 17X24 6PK STRL BLUE (TOWEL DISPOSABLE) ×3 IMPLANT
WAND STAR VAC 90 (SURGICAL WAND) IMPLANT
WATER STERILE IRR 1000ML POUR (IV SOLUTION) ×3 IMPLANT

## 2013-07-27 NOTE — Op Note (Signed)
Pre-Op Dx: Left knee lateral meniscal tear, medial meniscal tear chondromalacia, possible loose body  Postop Dx: Same   Procedure: Left knee arthroscopic partial medial and lateral meniscectomies, removal of loose bodies, debridement chondromalacia grade 3 and grade 4 to the lateral facet of the patella and lateral trochlea and anterior flange of the femoral condyle  Surgeon: Jason [name] M.D.  Assist: Jason [name]. Jason [name]  (present throughout entire procedure and necessary for timely completion of the procedure) Anes: General LMA  EBL: Minimal  Fluids: 800 cc   Indications: Patient has had effusions catching popping and pain in his left knee for the last few months. He is short he had a right knee replacement but x-rays in the office show good maintenance of cartilage height on standing AP films. He is felt that a lateral meniscal tear and possible chondromalacia or loose bodies. Pt has failed conservative treatment with anti-inflammatory medicines, physical therapy, and modified activites but did get good temporarily from an intra-articular cortisone injection. Pain has recurred and patient desires elective arthroscopic evaluation and treatment of knee. Risks and benefits of surgery have been discussed and questions answered.  Procedure: Patient identified by arm band and taken to the operating room at the day surgery Center. The appropriate anesthetic monitors were attached, and General LMA anesthesia was induced without difficulty. Lateral post was applied to the table and the lower extremity was prepped and draped in usual sterile fashion from the ankle to the midthigh. Time out procedure was performed. We began the operation by making standard inferior lateral and inferior medial peripatellar portals with a #11 blade allowing introduction of the arthroscope through the inferior lateral portal and the out flow to the inferior medial portal. Pump pressure was set at 100 mmHg and diagnostic  arthroscopy  revealed effusion with 80 cc when we placed the trocar the fluid came out with cartilaginous loose bodies in it. Diagnostic arthroscopy of the patellofemoral joint revealed grade 3 chondromalacia to the medial aspect of the joint and grade 4 chondromalacia bone on bone to the lateral facet and lateral aspect of the trochlea and anterior femoral condyle this was debrided. Moving into the medial compartment the patient had a small parrot-beak tear the posterior horn of the medial meniscus likewise debrided with a 3.5 mm Gator sucker shaver. The anterior cruciate ligament and PCL were intact. The lateral meniscus was badly torn and shredded going from anterior all the way around posteriorly and it was likewise debridement back to the rim. The lateral femoral condyle had grade 3 chondromalacia focal grade 4 with flap tears distally and posteriorly and was likewise debrided. We also found a loose body that was a centimeter by 5 mm x 8 mm on the lateral side that was removed with a 4.2 mm gray-white sucker shaver. The knee was irrigated out normal saline solution. A dressing of xerofoam 4 x 4 dressing sponges, web roll and an Ace wrap was applied. The patient was awakened extubated and taken to the recovery without difficulty.    Signed: Kerin Salen, MD

## 2013-07-27 NOTE — Anesthesia Preprocedure Evaluation (Signed)
Anesthesia Evaluation  Patient identified by MRN, date of birth, ID band Patient awake    Reviewed: Allergy & Precautions, H&P , NPO status , Patient's Chart, lab work & pertinent test results  Airway Mallampati: II TM Distance: >3 FB Neck ROM: Full    Dental no notable dental hx. (+) Teeth Intact and Dental Advisory Given   Pulmonary Recent URI , Residual Cough, former smoker,  breath sounds clear to auscultation  Pulmonary exam normal       Cardiovascular hypertension, + Peripheral Vascular Disease Rhythm:Regular Rate:Normal     Neuro/Psych  Headaches, CVA, No Residual Symptoms negative psych ROS   GI/Hepatic Neg liver ROS, GERD-  Medicated and Controlled,  Endo/Other  negative endocrine ROS  Renal/GU Renal disease  negative genitourinary   Musculoskeletal   Abdominal   Peds  Hematology negative hematology ROS (+)   Anesthesia Other Findings   Reproductive/Obstetrics negative OB ROS                           Anesthesia Physical Anesthesia Plan  ASA: III  Anesthesia Plan: General   Post-op Pain Management:    Induction: Intravenous  Airway Management Planned: LMA  Additional Equipment:   Intra-op Plan:   Post-operative Plan: Extubation in OR  Informed Consent: I have reviewed the patients History and Physical, chart, labs and discussed the procedure including the risks, benefits and alternatives for the proposed anesthesia with the patient or authorized representative who has indicated his/her understanding and acceptance.   Dental advisory given  Plan Discussed with: CRNA  Anesthesia Plan Comments:         Anesthesia Quick Evaluation

## 2013-07-27 NOTE — Anesthesia Postprocedure Evaluation (Signed)
  Anesthesia Post-op Note  Patient: Jason [name]  Procedure(s) Performed: Procedure(s): LEFT ARTHROSCOPY KNEE, LATERAL AND MEDIAL MENISECTOMY WITH DEBRIDEMENT (Left)  Patient Location: PACU  Anesthesia Type:General  Level of Consciousness: awake and alert   Airway and Oxygen Therapy: Patient Spontanous Breathing  Post-op Pain: none  Post-op Assessment: Post-op Vital signs reviewed, Patient's Cardiovascular Status Stable and Respiratory Function Stable  Post-op Vital Signs: Reviewed  Filed Vitals:   07/27/13 1426  BP: 142/76  Pulse: 81  Temp: 36.3 C  Resp: 16    Complications: No apparent anesthesia complications

## 2013-07-27 NOTE — Transfer of Care (Signed)
Immediate Anesthesia Transfer of Care Note  Patient: Jason [name]  Procedure(s) Performed: Procedure(s): LEFT ARTHROSCOPY KNEE, LATERAL AND MEDIAL MENISECTOMY WITH DEBRIDEMENT (Left)  Patient Location: PACU  Anesthesia Type:General  Level of Consciousness: awake, alert  and oriented  Airway & Oxygen Therapy: Patient Spontanous Breathing and Patient connected to face mask oxygen  Post-op Assessment: Report given to PACU RN and Post -op Vital signs reviewed and stable  Post vital signs: Reviewed and stable  Complications: No apparent anesthesia complications

## 2013-07-27 NOTE — Discharge Instructions (Signed)
Arthroscopic Procedure, Knee An arthroscopic procedure can find what is wrong with your knee. PROCEDURE Arthroscopy is a surgical technique that allows your orthopedic surgeon to diagnose and treat your knee injury with accuracy. They will look into your knee through a small instrument. This is almost like a small (pencil sized) telescope. Because arthroscopy affects your knee less than open knee surgery, you can anticipate a more rapid recovery. Taking an active role by following your caregiver's instructions will help with rapid and complete recovery. Use crutches, rest, elevation, ice, and knee exercises as instructed. The length of recovery depends on various factors including type of injury, age, physical condition, medical conditions, and your rehabilitation. Your knee is the joint between the large bones (femur and tibia) in your leg. Cartilage covers these bone ends which are smooth and slippery and allow your knee to bend and move smoothly. Two menisci, thick, semi-lunar shaped pads of cartilage which form a rim inside the joint, help absorb shock and stabilize your knee. Ligaments bind the bones together and support your knee joint. Muscles move the joint, help support your knee, and take stress off the joint itself. Because of this all programs and physical therapy to rehabilitate an injured or repaired knee require rebuilding and strengthening your muscles. AFTER THE PROCEDURE  After the procedure, you will be moved to a recovery area until most of the effects of the medication have worn off. Your caregiver will discuss the test results with you.  Only take over-the-counter or prescription medicines for pain, discomfort, or fever as directed by your caregiver. SEEK MEDICAL CARE IF:   You have increased bleeding from your wounds.  You see redness, swelling, or have increasing pain in your wounds.  You have pus coming from your wound.  You have an oral temperature above 102 F (38.9  C).  You notice a bad smell coming from the wound or dressing.  You have severe pain with any motion of your knee. SEEK IMMEDIATE MEDICAL CARE IF:   You develop a rash.  You have difficulty breathing.  You have any allergic problems. Document Released: 06/06/2000 Document Revised: 09/01/2011 Document Reviewed: 12/29/2007 [facility] Patient Information 2014 [place].  Post Anesthesia Home Care Instructions  Activity: Get plenty of rest for the remainder of the day. A responsible adult should stay with you for 24 hours following the procedure.  For the next 24 hours, DO NOT: -Drive a car -Paediatric nurse -Drink alcoholic beverages -Take any medication unless instructed by your physician -Make any legal decisions or sign important papers.  Meals: Start with liquid foods such as gelatin or soup. Progress to regular foods as tolerated. Avoid greasy, spicy, heavy foods. If nausea and/or vomiting occur, drink only clear liquids until the nausea and/or vomiting subsides. Call your physician if vomiting continues.  Special Instructions/Symptoms: Your throat may feel dry or sore from the anesthesia or the breathing tube placed in your throat during surgery. If this causes discomfort, gargle with warm salt water. The discomfort should disappear within 24 hours.   May leave the dressing on for 48 hours and then remove dressing and shower.  Keep dressing dry for two days. Elevate and ice knee for first week post op.

## 2013-07-27 NOTE — Interval H&P Note (Signed)
History and Physical Interval Note:  07/27/2013 11:37 AM  Jason [name]  has presented today for surgery, with the diagnosis of LEFT KNEE CHONDROMALACIA, LATERAL MENISCUS TEAR  The various methods of treatment have been discussed with the patient and family. After consideration of risks, benefits and other options for treatment, the patient has consented to  Procedure(s): LEFT ARTHROSCOPY KNEE (Left) as a surgical intervention .  The patient's history has been reviewed, patient examined, no change in status, stable for surgery.  I have reviewed the patient's chart and labs.  Questions were answered to the patient's satisfaction.     Kerin Salen

## 2013-07-27 NOTE — Anesthesia Procedure Notes (Signed)
Procedure Name: LMA Insertion Date/Time: 07/27/2013 11:56 AM Performed by: Maryella Shivers Pre-anesthesia Checklist: Patient identified, Emergency Drugs available, Suction available and Patient being monitored Patient Re-evaluated:Patient Re-evaluated prior to inductionOxygen Delivery Method: Circle System Utilized Preoxygenation: Pre-oxygenation with 100% oxygen Intubation Type: IV induction Ventilation: Mask ventilation without difficulty LMA: LMA inserted LMA Size: 5.0 Number of attempts: 1 Airway Equipment and Method: bite block Placement Confirmation: positive ETCO2 Tube secured with: Tape Dental Injury: Teeth and Oropharynx as per pre-operative assessment

## 2013-07-27 NOTE — Anesthesia Postprocedure Evaluation (Signed)
  Anesthesia Post-op Note  Patient: Jason [name]  Procedure(s) Performed: Procedure(s): LEFT ARTHROSCOPY KNEE, LATERAL AND MEDIAL MENISECTOMY WITH DEBRIDEMENT (Left)  Patient Location: PACU  Anesthesia Type:General  Level of Consciousness: awake  Airway and Oxygen Therapy: Patient Spontanous Breathing  Post-op Pain: mild  Post-op Assessment: Post-op Vital signs reviewed  Post-op Vital Signs: stable  Complications: No apparent anesthesia complications

## 2013-07-28 ENCOUNTER — Encounter (HOSPITAL_BASED_OUTPATIENT_CLINIC_OR_DEPARTMENT_OTHER): Payer: Self-pay | Admitting: Orthopedic Surgery

## 2013-08-11 ENCOUNTER — Ambulatory Visit (INDEPENDENT_AMBULATORY_CARE_PROVIDER_SITE_OTHER): Payer: Self-pay | Admitting: *Deleted

## 2013-08-11 DIAGNOSIS — I639 Cerebral infarction, unspecified: Secondary | ICD-10-CM

## 2013-08-11 DIAGNOSIS — I6350 Cerebral infarction due to unspecified occlusion or stenosis of unspecified cerebral artery: Secondary | ICD-10-CM

## 2013-08-11 NOTE — Progress Notes (Signed)
Participant in the office today for his 3 rd. Annual visit for the IRIS study. MMSE was completed successfully without any problems. Blood was  drawn and shipped.  Used bottle of medication was shipped back to the [employer] [facility].  Participant to continue with follow-up visits per Iris protocol.  Participant's Exit visit will be in early June, 2015.

## 2014-02-23 ENCOUNTER — Encounter: Payer: Self-pay | Admitting: Gastroenterology

## 2014-09-11 ENCOUNTER — Telehealth: Payer: Self-pay | Admitting: Neurology

## 2014-09-11 NOTE — Telephone Encounter (Signed)
Patient was in the IRIS study. Patient received letter from study team saying that he was in the real study medicine and not placebo. Patient wants to get a prescription for more of this medicine. Thus, an office visit appointment was scheduled for Wednesday, [id number].

## 2014-11-15 ENCOUNTER — Encounter: Payer: Self-pay | Admitting: Neurology

## 2014-11-15 ENCOUNTER — Ambulatory Visit (INDEPENDENT_AMBULATORY_CARE_PROVIDER_SITE_OTHER): Payer: Medicare Other | Admitting: Neurology

## 2014-11-15 VITALS — BP 130/88 | HR 58 | Wt 219.8 lb

## 2014-11-15 DIAGNOSIS — I63031 Cerebral infarction due to thrombosis of right carotid artery: Secondary | ICD-10-CM

## 2014-11-15 NOTE — Progress Notes (Signed)
[facility] [address] [place]. [place] [address] [phone]       OFFICE FOLLOW-UP NOTE  [name] Date of Birth:  1935-12-24 Medical Record Number:  [id number]   HPI: 45 year Caucasian male who has remote history of TIA with transient speech disturbance in 2011 as well as left hand weakness in January 2014 due to right frontal small infarct felt to be due to small vessel disease. At that time he was switched from Aggrenox to Plavix and has done well He participated in the iris trial and completed participation last year. He found out that he was getting the active medication Actos. He called the office requesting a visit to discuss starting this medicine. He has done well and has not had any recurrent stroke or TIA since January 2014. Is tolerating Plavix well without bleeding, bruising or other side effects. He states his blood pressure is quite good and today it is 130/88 in office. He had last lipid profile checked in January and it was okay as per his primary physician. He complains of mild occasional headaches which are sporadic 1 or 2/10 in severity  And pressure-like last only a few minutes. These often triggered by drinking coffee or tea and he has learned to cut back that.  ROS:   14 system review of systems is positive for     mild headaches and fatigue all other systems negative  PMH:  Past Medical History  Diagnosis Date  . Allergic rhinitis, cause unspecified   . Headache(784.0)   . Pure hyperglyceridemia   . Unspecified transient cerebral ischemia 02/2010  . Chronic kidney disease   . Organic insomnia, unspecified   . GERD (gastroesophageal reflux disease)   . Recurrent upper respiratory infection (URI)     states chest congestion x 3-4 weeks, no fever- states was productive small amt yellow phlegm, now is clear  . Stroke     left basal ganglia infarction 9/11- LOV DR [name] 7/12 on chart  . Unspecified cerebral artery occlusion with  cerebral infarction   . Hypertension     stress test 5 years ago, eccho 9/11 on chart, Dr. Rayann Heman    Social History:  History   Social History  . Marital Status: Married    Spouse Name: N/A  . Number of Children: 2  . Years of Education: N/A   Occupational History  . OWNER    Social History Main Topics  . Smoking status: Former Smoker -- 2.00 packs/day for 10 years    Quit date: 07/21/1973  . Smokeless tobacco: Former Systems developer    Quit date: 06/23/1973  . Alcohol Use: 3.6 oz/week    3 Glasses of wine, 3 Shots of liquor per week     Comment: occ to daily  . Drug Use: No  . Sexual Activity: Not on file   Other Topics Concern  . Not on file   Social History Narrative    Medications:   Current Outpatient Prescriptions on File Prior to Visit  Medication Sig Dispense Refill  . clopidogrel (PLAVIX) 75 MG tablet Take 1 tablet (75 mg total) by mouth daily. 90 tablet 1  . ezetimibe-simvastatin (VYTORIN) 10-10 MG per tablet Take 1 tablet by mouth every morning.     . fenofibrate (TRICOR) 48 MG tablet Take 48 mg by mouth every morning.     Marland Kitchen HYDROcodone-acetaminophen (NORCO) 5-325 MG per tablet Take 1 tablet by mouth every 6 (six) hours as needed. 60 tablet 0  .  zolpidem (AMBIEN) 5 MG tablet Take 10 mg by mouth at bedtime as needed. Sleep      No current facility-administered medications on file prior to visit.    Allergies:  No Known Allergies  Physical Exam General: well developed, well nourished, elderly Caucasian male seated, in no evident distress Head: head normocephalic and atraumatic.  Neck: supple with no carotid or supraclavicular bruits Cardiovascular: regular rate and rhythm, no murmurs Musculoskeletal: no deformity. Mild kyphosis Skin:  no rash/petichiae Vascular:  Normal pulses all extremities Filed Vitals:   11/15/14 1111  BP: 130/88  Pulse: 58   Neurologic Exam Mental Status: Awake and fully alert. Oriented to place and time. Recent and remote memory  intact. Attention span, concentration and fund of knowledge appropriate. Mood and affect appropriate.  Cranial Nerves: Fundoscopic exam reveals sharp disc margins. Pupils equal, briskly reactive to light. Mild ptosis of right eye which is chronic per patient Extraocular movements full without nystagmus. Visual fields full to confrontation. Hearing intact. Facial sensation intact. Face, tongue, palate moves normally and symmetrically.  Motor: Normal bulk and tone. Normal strength in all tested extremity muscles. Sensory.: intact to touch ,pinprick .position and vibratory sensation.  Coordination: Rapid alternating movements normal in all extremities. Finger-to-nose and heel-to-shin performed accurately bilaterally. Gait and Station: Arises from chair without difficulty. Stance is normal. Gait demonstrates normal stride length and balance . Able to heel, toe and tandem walk without difficulty.  Reflexes: 1+ and symmetric. Toes downgoing.   NIHSS  0 Modified Rankin  0   ASSESSMENT: 11 year Caucasian male with remote history of TIA in 2011 followed by small right frontal cortical infarct in January 2014 with no residual deficits who participated in the IRIS Vascular risk factors of hypertension and hyperlipidemia and cerebrovascular disease rial    PLAN: I had a long d/w patient about his remote stroke, risk for recurrent stroke/TIAs, personally independently reviewed imaging studies and stroke evaluation results and answered questions.Continue Plavix  for secondary stroke prevention and maintain strict control of hypertension with blood pressure goal below 130/90, diabetes with hemoglobin A1c goal below 6.5% and lipids with LDL cholesterol goal below 70 mg/dL. I also advised the patient to eat a healthy diet with plenty of whole grains, cereals, fruits and vegetables, exercise regularly and maintain ideal body weight. Check screening carotid ultrasound study as he has not had it for more than a year.  The patient wanted a prescription for Actos which he was getting as part of the IRIS trial but I counseled him against this as even though the trial was positive there was concern about increased fracture risk and there is no FDA approval for Actos for stroke prevention yet .I also advised him to participate in activities for stress relaxation for his tension headaches. Followup in the future with me in  1 year .  Antony Contras, MD   Note: This document was prepared with digital dictation and possible smart phrase technology. Any transcriptional errors that result from this process are unintentional

## 2014-11-15 NOTE — Patient Instructions (Signed)
I had a long d/w patient about his remote stroke, risk for recurrent stroke/TIAs, personally independently reviewed imaging studies and stroke evaluation results and answered questions.Continue Plavix  for secondary stroke prevention and maintain strict control of hypertension with blood pressure goal below 130/90, diabetes with hemoglobin A1c goal below 6.5% and lipids with LDL cholesterol goal below 70 mg/dL. I also advised the patient to eat a healthy diet with plenty of whole grains, cereals, fruits and vegetables, exercise regularly and maintain ideal body weight. Check screening carotid ultrasound study as he has not had it for more than a year. I advised him to participate in activities for stress relaxation for his tension headaches. Followup in the future with me in  1 year .

## 2014-11-27 ENCOUNTER — Telehealth: Payer: Self-pay

## 2014-11-27 NOTE — Telephone Encounter (Signed)
Spoke with patient to schedule US Carotid Bilateral, schedule for 12/05/2014 @ 1:30.  Patient verbalized understanding.

## 2014-12-05 ENCOUNTER — Ambulatory Visit (INDEPENDENT_AMBULATORY_CARE_PROVIDER_SITE_OTHER): Payer: Medicare Other

## 2014-12-05 DIAGNOSIS — I63031 Cerebral infarction due to thrombosis of right carotid artery: Secondary | ICD-10-CM

## 2015-11-07 ENCOUNTER — Encounter: Payer: Self-pay | Admitting: Nurse Practitioner

## 2015-11-07 ENCOUNTER — Ambulatory Visit (INDEPENDENT_AMBULATORY_CARE_PROVIDER_SITE_OTHER): Payer: Medicare Other | Admitting: Nurse Practitioner

## 2015-11-07 VITALS — BP 134/84 | HR 68 | Ht 73.0 in | Wt 216.4 lb

## 2015-11-07 DIAGNOSIS — I6350 Cerebral infarction due to unspecified occlusion or stenosis of unspecified cerebral artery: Secondary | ICD-10-CM

## 2015-11-07 DIAGNOSIS — E7800 Pure hypercholesterolemia, unspecified: Secondary | ICD-10-CM

## 2015-11-07 DIAGNOSIS — I10 Essential (primary) hypertension: Secondary | ICD-10-CM

## 2015-11-07 NOTE — Progress Notes (Signed)
[facility]  PATIENT: Jason [name] DOB: 10-06-1935   REASON FOR VISIT: Follow-up for history of stroke TIA HISTORY FROM: Patient    HISTORY OF PRESENT ILLNESS:UPDATE 11/07/15 [name], 80 year old male returns for follow-up. He has a history of TIA with transient speech disturbance in 2011 and then left hand weakness in January 2014 due to right frontal small infarct. He has not had further stroke or TIA symptoms since that time he is currently on Plavix with minimal bruising. Blood pressure is well controlled and is 134/84 in the office today. Lipid panel was checked in January 2017 he was told  it was good. Carotid Doppler 6/13/ 2016 showed an abnormally elevated BFV seen in the left ECA consistent with stenosis of this vessel segment. Mild wall thickness was present in the right carotid bulb. He gets minimal exercise mainly because his wife has dementia and he cannot leave her for long periods of time. He used to play golf. He returns for reevaluation    HISTORY: 11/07/14 PS79 year Caucasian male who has remote history of TIA with transient speech disturbance in 2011 as well as left hand weakness in January 2014 due to right frontal small infarct felt to be due to small vessel disease. At that time he was switched from Aggrenox to Plavix and has done well He participated in the iris trial and completed participation last year. He found out that he was getting the active medication Actos. He called the office requesting a visit to discuss starting this medicine. He has done well and has not had any recurrent stroke or TIA since January 2014. Is tolerating Plavix well without bleeding, bruising or other side effects. He states his blood pressure is quite good and today it is 130/88 in office. He had last lipid profile checked in January and it was okay as per his primary physician. He complains of mild occasional headaches which are sporadic 1 or 2/10 in severity And  pressure-like last only a few minutes. These often triggered by drinking coffee or tea and he has learned to cut back that.   REVIEW OF SYSTEMS: Full 14 system review of systems performed and notable only for those listed, all others are neg:  Constitutional: neg  Cardiovascular: neg Ear/Nose/Throat: neg  Skin: neg Eyes: neg Respiratory: neg Gastroitestinal: neg  Hematology/Lymphatic: neg  Endocrine: neg Musculoskeletal:neg Allergy/Immunology: neg Neurological: neg Psychiatric: neg Sleep : neg   ALLERGIES: No Known Allergies  HOME MEDICATIONS: Outpatient Prescriptions Prior to Visit  Medication Sig Dispense Refill  . clopidogrel (PLAVIX) 75 MG tablet Take 1 tablet (75 mg total) by mouth daily. 90 tablet 1  . ezetimibe-simvastatin (VYTORIN) 10-10 MG per tablet Take 1 tablet by mouth every morning.     . fenofibrate (TRICOR) 48 MG tablet Take 48 mg by mouth every morning.     Marland Kitchen HYDROcodone-acetaminophen (NORCO) 5-325 MG per tablet Take 1 tablet by mouth every 6 (six) hours as needed. 60 tablet 0  . zolpidem (AMBIEN) 5 MG tablet Take 10 mg by mouth at bedtime as needed. Sleep      No facility-administered medications prior to visit.    PAST MEDICAL HISTORY: Past Medical History  Diagnosis Date  . Allergic rhinitis, cause unspecified   . Headache(784.0)   . Pure hyperglyceridemia   . Unspecified transient cerebral ischemia 02/2010  . Chronic kidney disease   . Organic insomnia, unspecified   . GERD (gastroesophageal reflux disease)   . Recurrent upper respiratory infection (URI)  states chest congestion x 3-4 weeks, no fever- states was productive small amt yellow phlegm, now is clear  . Stroke [facility])     left basal ganglia infarction 9/11- LOV DR [name] 7/12 on chart  . Unspecified cerebral artery occlusion with cerebral infarction   . Hypertension     stress test 5 years ago, eccho 9/11 on chart, Dr. Rayann Heman    PAST SURGICAL HISTORY: Past Surgical History    Procedure Laterality Date  . Knee surgery      x2-right  . Tonsillectomy    . Umbilical hernia repair  05/21/2011    Procedure: HERNIA REPAIR UMBILICAL ADULT;  Surgeon: Harl Bowie, MD;  Location: [facility];  Service: General;  Laterality: N/A;  . Cholecystectomy  06/17/2012    Procedure: LAPAROSCOPIC CHOLECYSTECTOMY;  Surgeon: Ralene Ok, MD;  Location: [facility];  Service: General;  Laterality: N/A;  . Joint replacement  2008    right total knee replacement x 2  . Colonoscopy    . Knee arthroscopy Left 07/27/2013    Procedure: LEFT ARTHROSCOPY KNEE, LATERAL AND MEDIAL MENISECTOMY WITH DEBRIDEMENT;  Surgeon: Kerin Salen, MD;  Location: [facility];  Service: Orthopedics;  Laterality: Left;    FAMILY HISTORY: Family History  Problem Relation Age of Onset  . Colon cancer Neg Hx     SOCIAL HISTORY: Social History   Social History  . Marital Status: Married    Spouse Name: N/A  . Number of Children: 2  . Years of Education: N/A   Occupational History  . OWNER    Social History Main Topics  . Smoking status: Former Smoker -- 2.00 packs/day for 10 years    Quit date: 07/21/1973  . Smokeless tobacco: Former Systems developer    Quit date: 06/23/1973  . Alcohol Use: 3.6 oz/week    3 Glasses of wine, 3 Shots of liquor per week     Comment: occ to daily  . Drug Use: No  . Sexual Activity: Not on file   Other Topics Concern  . Not on file   Social History Narrative     PHYSICAL EXAM  Filed Vitals:   11/07/15 1417  BP: 134/84  Pulse: 68  Height: 6\' 1"  (1.854 m)  Weight: 216 lb 6.4 oz (98.158 kg)   Body mass index is 28.56 kg/(m^2). General: well developed, well nourished, elderly Caucasian male seated, in no evident distress Head: head normocephalic and atraumatic.  Neck: supple with no carotid  bruits Cardiovascular: regular rate and rhythm, no murmurs Musculoskeletal: no deformity. Mild kyphosis Skin: no rash/petichiae Vascular: Normal pulses all  extremities    Neurological examination   Mentation:  Awake and fully alert. Oriented to place and time. Recent and remote memory intact. Attention span, concentration and fund of knowledge appropriate. Mood and affect appropriate.  Cranial Nerves: Fundoscopic exam reveals sharp disc margins. Pupils equal, briskly reactive to light. Mild ptosis of right eye which is chronic per patient Extraocular movements full without nystagmus. Visual fields full to confrontation. Hearing intact. Facial sensation intact. Face, tongue, palate moves normally and symmetrically.  Motor: Normal bulk and tone. Normal strength in all tested extremity muscles. Sensory.: intact to touch , in the upper and lower extremities  Coordination: Rapid alternating movements normal in all extremities. Finger-to-nose and heel-to-shin performed accurately bilaterally. Gait and Station: Arises from chair without difficulty. Stance is normal. Gait demonstrates normal stride length and balance . Able to heel, toe and tandem walk without difficulty.  Reflexes:  1+ and symmetric. Toes downgoing.  DIAGNOSTIC DATA (LABS, IMAGING, TESTING) - ASSESSMENT AND PLAN 72 year Caucasian male with remote history of TIA in 2011 followed by small right frontal cortical infarct in January 2014 with no residual deficits who participated in the IRIS Vascular risk factors of hypertension and hyperlipidemia and cerebrovascular disease. The patient is a current patient of Dr. [name]  who is out of the office today . This note is sent to the work in doctor.     Stressed the importance of management of risk factors to prevent further stroke Continue Plavix for secondary stroke prevention Maintain strict control of hypertension with blood pressure goal below 130/90, today's reading 134/84  Cholesterol with LDL cholesterol less than 70, followed by primary care,   Carotid Doppler 6/13/ 2016 showed an abnormally elevated BFV seen in the left ECA consistent  with stenosis of this vessel segment. Mild wall thickness was present in the right carotid bulb. Reviewed these results patient given a copy Exercise by walking, slowly increase , eat healthy diet with whole grains,  fresh fruits and vegetables Follow-up yearly and when necessary  Dennie Bible, [facility], [facility], APRN  [facility] [address] [place], [place] [address] [phone] That he is doing

## 2015-11-07 NOTE — Patient Instructions (Signed)
Stressed the importance of management of risk factors to prevent further stroke Continue Plavix for secondary stroke prevention Maintain strict control of hypertension with blood pressure goal below 130/90, today's reading 134/84  Ch he olesterol with LDL cholesterol less than 70, followed by primary care,    Exercise by walking, slowly increase , eat healthy diet with whole grains,  fresh fruits and vegetables  follow-up yearly and when necessary

## 2015-11-15 ENCOUNTER — Ambulatory Visit: Payer: Medicare Other | Admitting: Neurology

## 2015-11-27 NOTE — Progress Notes (Signed)
I reviewed note and agree with plan.   Penni Bombard, MD [id number], [id number] PM Certified in Neurology, Neurophysiology and Neuroimaging  [facility] [address], [place], [place] [address] [phone]

## 2016-11-10 ENCOUNTER — Encounter (INDEPENDENT_AMBULATORY_CARE_PROVIDER_SITE_OTHER): Payer: Self-pay

## 2016-11-10 ENCOUNTER — Ambulatory Visit (INDEPENDENT_AMBULATORY_CARE_PROVIDER_SITE_OTHER): Payer: Medicare Other | Admitting: Nurse Practitioner

## 2016-11-10 ENCOUNTER — Encounter: Payer: Self-pay | Admitting: Nurse Practitioner

## 2016-11-10 VITALS — BP 118/70 | HR 69 | Ht 73.0 in | Wt 219.4 lb

## 2016-11-10 DIAGNOSIS — E782 Mixed hyperlipidemia: Secondary | ICD-10-CM

## 2016-11-10 DIAGNOSIS — I10 Essential (primary) hypertension: Secondary | ICD-10-CM | POA: Diagnosis not present

## 2016-11-10 DIAGNOSIS — I6350 Cerebral infarction due to unspecified occlusion or stenosis of unspecified cerebral artery: Secondary | ICD-10-CM

## 2016-11-10 DIAGNOSIS — E785 Hyperlipidemia, unspecified: Secondary | ICD-10-CM | POA: Insufficient documentation

## 2016-11-10 DIAGNOSIS — I639 Cerebral infarction, unspecified: Secondary | ICD-10-CM

## 2016-11-10 NOTE — Patient Instructions (Addendum)
Stressed the importance of management of risk factors to prevent further stroke Continue Plavix for secondary stroke prevention Maintain strict control of hypertension with blood pressure goal below 130/90, today's reading 118/70  Cholesterol with LDL cholesterol less than 70, followed by primary care,   Carotid Doppler 6/13/ 2016 showed an abnormally elevated BFV seen in the left ECA consistent with stenosis of this vessel segment. Mild wall thickness was present in the right carotid bulb.Will repeat and if necessary refer to CVTS to follow over time  Exercise by walking,  eat healthy diet with whole grains,  fresh fruits and vegetables Will discharge from stroke clinic, no further stroke symptoms since 2014Stroke Prevention Some medical conditions and behaviors are associated with an increased chance of having a stroke. You may prevent a stroke by making healthy choices and managing medical conditions. How can I reduce my risk of having a stroke?  Stay physically active. Get at least 30 minutes of activity on most or all days.  Do not smoke. It may also be helpful to avoid exposure to secondhand smoke.  Limit alcohol use. Moderate alcohol use is considered to be:  No more than 2 drinks per day for men.  No more than 1 drink per day for nonpregnant women.  Eat healthy foods. This involves:  Eating 5 or more servings of fruits and vegetables a day.  Making dietary changes that address high blood pressure (hypertension), high cholesterol, diabetes, or obesity.  Manage your cholesterol levels.  Making food choices that are high in fiber and low in saturated fat, trans fat, and cholesterol may control cholesterol levels.  Take any prescribed medicines to control cholesterol as directed by your health care provider.  Manage your diabetes.  Controlling your carbohydrate and sugar intake is recommended to manage diabetes.  Take any prescribed medicines to control diabetes as directed by  your health care provider.  Control your hypertension.  Making food choices that are low in salt (sodium), saturated fat, trans fat, and cholesterol is recommended to manage hypertension.  Ask your health care provider if you need treatment to lower your blood pressure. Take any prescribed medicines to control hypertension as directed by your health care provider.  If you are 80-37 years of age, have your blood pressure checked every 3-5 years. If you are 63 years of age or older, have your blood pressure checked every year.  Maintain a healthy weight.  Reducing calorie intake and making food choices that are low in sodium, saturated fat, trans fat, and cholesterol are recommended to manage weight.  Stop drug abuse.  Avoid taking birth control pills.  Talk to your health care provider about the risks of taking birth control pills if you are over 68 years old, smoke, get migraines, or have ever had a blood clot.  Get evaluated for sleep disorders (sleep apnea).  Talk to your health care provider about getting a sleep evaluation if you snore a lot or have excessive sleepiness.  Take medicines only as directed by your health care provider.  For some people, aspirin or blood thinners (anticoagulants) are helpful in reducing the risk of forming abnormal blood clots that can lead to stroke. If you have the irregular heart rhythm of atrial fibrillation, you should be on a blood thinner unless there is a good reason you cannot take them.  Understand all your medicine instructions.  Make sure that other conditions (such as anemia or atherosclerosis) are addressed. Get help right away if:  You have  sudden weakness or numbness of the face, arm, or leg, especially on one side of the body.  Your face or eyelid droops to one side.  You have sudden confusion.  You have trouble speaking (aphasia) or understanding.  You have sudden trouble seeing in one or both eyes.  You have sudden  trouble walking.  You have dizziness.  You have a loss of balance or coordination.  You have a sudden, severe headache with no known cause.  You have new chest pain or an irregular heartbeat. Any of these symptoms may represent a serious problem that is an emergency. Do not wait to see if the symptoms will go away. Get medical help at once. Call your local emergency services (911 in [place] Do not drive yourself to the hospital. This information is not intended to replace advice given to you by your health care provider. Make sure you discuss any questions you have with your health care provider. Document Released: 07/17/2004 Document Revised: 11/15/2015 Document Reviewed: 12/10/2012 Elsevier Interactive Patient Education  2017 [employer].

## 2016-11-10 NOTE — Progress Notes (Signed)
[facility]  PATIENT: Jason [name] DOB: [birth year]   REASON FOR VISIT: Follow-up for history of stroke TIA HISTORY FROM: Patient    HISTORY OF PRESENT ILLNESS:UPDATE 05/21/2018CM Jason [name], 81 year old male returns for follow-up with history of stroke in 2014. He has not had further stroke or TIA symptoms he remains on Plavix for secondary stroke prevention with minimal bruising and no  Bleeding. Blood pressure in the office today well-controlled at 118/70. He remains on Vytorin and TriCor for lipid management followed by his primary care. He gets minimal exercise due to caring for wife with dementia. He can only for prolonged periods of time. Carotid Doppler 6/13/ 2016 showed an abnormally elevated BFV seen in the left ECA consistent with stenosis of this vessel segment. Mild wall thickness was present in the right carotid bulb.He returns for reevaluation UPDATE 11/07/15 [name], 81 year old male returns for follow-up. He has a history of TIA with transient speech disturbance in 2011 and then left hand weakness in January 2014 due to right frontal small infarct. He has not had further stroke or TIA symptoms since that time he is currently on Plavix with minimal bruising. Blood pressure is well controlled and is 134/84 in the office today. Lipid panel was checked in January 2017 he was told  it was good. Carotid Doppler 6/13/ 2016 showed an abnormally elevated BFV seen in the left ECA consistent with stenosis of this vessel segment. Mild wall thickness was present in the right carotid bulb. He gets minimal exercise mainly because his wife has dementia and he cannot leave her for long periods of time. He used to play golf. He returns for reevaluation    HISTORY: 11/07/14 PS79 year Caucasian male who has remote history of TIA with transient speech disturbance in 2011 as well as left hand weakness in January 2014 due to right frontal small infarct felt to be due to small  vessel disease. At that time he was switched from Aggrenox to Plavix and has done well He participated in the iris trial and completed participation last year. He found out that he was getting the active medication Actos. He called the office requesting a visit to discuss starting this medicine. He has done well and has not had any recurrent stroke or TIA since January 2014. Is tolerating Plavix well without bleeding, bruising or other side effects. He states his blood pressure is quite good and today it is 130/88 in office. He had last lipid profile checked in January and it was okay as per his primary physician. He complains of mild occasional headaches which are sporadic 1 or 2/10 in severity And pressure-like last only a few minutes. These often triggered by drinking coffee or tea and he has learned to cut back that.   REVIEW OF SYSTEMS: Full 14 system review of systems performed and notable only for those listed, all others are neg:  Constitutional: neg  Cardiovascular: neg Ear/Nose/Throat: neg  Skin: neg Eyes: neg Respiratory: neg Gastroitestinal: neg  Hematology/Lymphatic: Easy bruising Endocrine: neg Musculoskeletal:neg Allergy/Immunology: neg Neurological: neg Psychiatric: neg Sleep : neg   ALLERGIES: No Known Allergies  HOME MEDICATIONS: Outpatient Medications Prior to Visit  Medication Sig Dispense Refill  . clopidogrel (PLAVIX) 75 MG tablet Take 1 tablet (75 mg total) by mouth daily. 90 tablet 1  . ezetimibe-simvastatin (VYTORIN) 10-10 MG per tablet Take 1 tablet by mouth every morning.     . fenofibrate (TRICOR) 48 MG tablet Take 48 mg by mouth every  morning.     Marland Kitchen HYDROcodone-acetaminophen (NORCO) 5-325 MG per tablet Take 1 tablet by mouth every 6 (six) hours as needed. 60 tablet 0  . zolpidem (AMBIEN) 5 MG tablet Take 10 mg by mouth at bedtime as needed. Sleep      No facility-administered medications prior to visit.     PAST MEDICAL HISTORY: Past Medical History:   Diagnosis Date  . Allergic rhinitis, cause unspecified   . Chronic kidney disease   . GERD (gastroesophageal reflux disease)   . Headache(784.0)   . Hypertension    stress test 5 years ago, eccho 9/11 on chart, Dr. Rayann Heman  . Organic insomnia, unspecified   . Pure hyperglyceridemia   . Recurrent upper respiratory infection (URI)    states chest congestion x 3-4 weeks, no fever- states was productive small amt yellow phlegm, now is clear  . Stroke [facility])    left basal ganglia infarction 9/11- LOV DR [name] 7/12 on chart  . Unspecified cerebral artery occlusion with cerebral infarction   . Unspecified transient cerebral ischemia 02/2010    PAST SURGICAL HISTORY: Past Surgical History:  Procedure Laterality Date  . CHOLECYSTECTOMY  06/17/2012   Procedure: LAPAROSCOPIC CHOLECYSTECTOMY;  Surgeon: Ralene Ok, MD;  Location: [facility];  Service: General;  Laterality: N/A;  . COLONOSCOPY    . JOINT REPLACEMENT  2008   right total knee replacement x 2  . KNEE ARTHROSCOPY Left 07/27/2013   Procedure: LEFT ARTHROSCOPY KNEE, LATERAL AND MEDIAL MENISECTOMY WITH DEBRIDEMENT;  Surgeon: Kerin Salen, MD;  Location: [facility];  Service: Orthopedics;  Laterality: Left;  . KNEE SURGERY     x2-right  . TONSILLECTOMY    . UMBILICAL HERNIA REPAIR  05/21/2011   Procedure: HERNIA REPAIR UMBILICAL ADULT;  Surgeon: Harl Bowie, MD;  Location: [facility];  Service: General;  Laterality: N/A;    FAMILY HISTORY: Family History  Problem Relation Age of Onset  . Colon cancer Neg Hx     SOCIAL HISTORY: Social History   Social History  . Marital status: Married    Spouse name: N/A  . Number of children: 2  . Years of education: N/A   Occupational History  . OWNER [employer]   Social History Main Topics  . Smoking status: Former Smoker    Packs/day: 2.00    Years: 10.00    Quit date: 07/21/1973  . Smokeless tobacco: Former Systems developer    Quit date: 06/23/1973  . Alcohol  use 3.6 oz/week    3 Glasses of wine, 3 Shots of liquor per week     Comment: occ to daily  . Drug use: No  . Sexual activity: Not on file   Other Topics Concern  . Not on file   Social History Narrative  . No narrative on file     PHYSICAL EXAM  Vitals:   11/10/16 1054  BP: 118/70  Pulse: 69  Weight: 219 lb 6.4 oz (99.5 kg)  Height: 6\' 1"  (1.854 m)   Body mass index is 28.95 kg/m. General: well developed, well nourished, elderly Caucasian male seated, in no evident distress Head: head normocephalic and atraumatic.  Neck: supple with no carotid  bruits Cardiovascular: regular rate and rhythm, no murmurs Musculoskeletal: no deformity. Mild kyphosis Skin: no rash/petichiae Vascular: Normal pulses all extremities    Neurological examination   Mentation:  Awake and fully alert. Oriented to place and time. Recent and remote memory intact. Attention span, concentration and fund  of knowledge appropriate. Mood and affect appropriate.  Cranial Nerves: Pupils equal, briskly reactive to light. Mild ptosis of right eye which is chronic per patient Extraocular movements full without nystagmus. Visual fields full to confrontation. Hearing intact. Facial sensation intact. Face, tongue, palate moves normally and symmetrically.  Motor: Normal bulk and tone. Normal strength in all tested extremity muscles. Sensory.: intact to touch , in the upper and lower extremities  Coordination: Rapid alternating movements normal in all extremities. Finger-to-nose and heel-to-shin performed accurately bilaterally. Gait and Station: Arises from chair without difficulty. Stance is normal. Gait demonstrates normal stride length and balance . Able to heel, toe and tandem walk without difficulty. No assistive device Reflexes: 1+ and symmetric. Toes downgoing.  DIAGNOSTIC DATA (LABS, IMAGING, TESTING) - ASSESSMENT AND PLAN 5 year Caucasian male with remote history of TIA in 2011 followed by small  right frontal cortical infarct in January 2014 with no residual deficits who participated in the IRIS Vascular risk factors of hypertension and hyperlipidemia and cerebrovascular disease. The patient is a current patient of Dr. [name]  who is out of the office today . This note is sent to the work in doctor.     PLAN: Stressed the importance of management of risk factors to prevent further stroke Continue Plavix for secondary stroke prevention Maintain strict control of hypertension with blood pressure goal below 130/90, today's reading 118/70  Cholesterol with LDL cholesterol less than 70, followed by primary care,   Carotid Doppler 6/13/ 2016 showed an abnormally elevated BFV seen in the left ECA consistent with stenosis of this vessel segment. Mild wall thickness was present in the right carotid bulb.Will repeat and refer to CVTS to follow over time if abnormal  Exercise by walking,  eat healthy diet with whole grains,  fresh fruits and vegetables Will discharge from stroke clinic, no further stroke symptoms since 2014 Continue her regular follow-up with your primary care I spent 25 min  in total face to face time with the patient more than 50% of which was spent counseling and coordination of care, reviewing test results reviewing medications and discussing and reviewing the diagnosis of stroke and risk factor management and the purpose of carotid Doppler follow-up and further treatment options. , Dennie Bible, [facility], [facility], APRN  [facility] [address] [place], [place] [address] [phone] That he is doing

## 2016-11-12 NOTE — Progress Notes (Signed)
I have read the note, and I agree with the clinical assessment and plan.  Linah Klapper A. Chrisotpher Rivero, MD, PhD Certified in Neurology, Clinical Neurophysiology, Sleep Medicine, Pain Medicine and Neuroimaging  [facility] [address], [address] [place], [place] [address] [phone]  

## 2016-11-24 ENCOUNTER — Ambulatory Visit
Admission: RE | Admit: 2016-11-24 | Discharge: 2016-11-24 | Disposition: A | Discharge status: Home / Self Care | Payer: Medicare Other | Source: Ambulatory Visit | Attending: Nurse Practitioner | Admitting: Nurse Practitioner

## 2016-11-24 DIAGNOSIS — I10 Essential (primary) hypertension: Secondary | ICD-10-CM

## 2016-11-24 DIAGNOSIS — E782 Mixed hyperlipidemia: Secondary | ICD-10-CM

## 2016-11-24 DIAGNOSIS — I639 Cerebral infarction, unspecified: Secondary | ICD-10-CM

## 2016-11-24 DIAGNOSIS — I6350 Cerebral infarction due to unspecified occlusion or stenosis of unspecified cerebral artery: Secondary | ICD-10-CM

## 2016-11-25 ENCOUNTER — Telehealth: Payer: Self-pay | Admitting: *Deleted

## 2016-11-25 NOTE — Telephone Encounter (Addendum)
Spoke to pt and relayed that the doppler study (carotid) results are unchanged from 2 yrs ago.  (showing minimal plaque in the R carotid bulb and proximal R ICA.  Estimated R ICA stenosis is less than 50%.  No evidence of L sided carotid plaque pr stenosis.  Needs to be followed over time.  Pt stated can send to pcp.  Faxed to pcp.  [phone].

## 2017-05-26 ENCOUNTER — Telehealth: Payer: Self-pay

## 2017-05-26 NOTE — Telephone Encounter (Signed)
Clearance form fax to [employer] x2 to [employer] [facility]. Fax confirmed x2.

## 2017-10-06 ENCOUNTER — Encounter: Payer: Self-pay | Admitting: Gastroenterology

## 2017-10-15 ENCOUNTER — Encounter: Payer: Self-pay | Admitting: Gastroenterology

## 2017-12-10 ENCOUNTER — Telehealth: Payer: Self-pay

## 2017-12-10 ENCOUNTER — Ambulatory Visit: Payer: Medicare Other | Admitting: Gastroenterology

## 2017-12-10 ENCOUNTER — Encounter (INDEPENDENT_AMBULATORY_CARE_PROVIDER_SITE_OTHER): Payer: Self-pay

## 2017-12-10 ENCOUNTER — Encounter: Payer: Self-pay | Admitting: Gastroenterology

## 2017-12-10 VITALS — BP 110/70 | HR 76 | Ht 71.0 in | Wt 214.5 lb

## 2017-12-10 DIAGNOSIS — Z86010 Personal history of colonic polyps: Secondary | ICD-10-CM

## 2017-12-10 DIAGNOSIS — Z7902 Long term (current) use of antithrombotics/antiplatelets: Secondary | ICD-10-CM | POA: Diagnosis not present

## 2017-12-10 MED ORDER — SUPREP BOWEL PREP KIT 17.5-3.13-1.6 GM/177ML PO SOLN: ORAL | 0 refills | Status: DC

## 2017-12-10 NOTE — Telephone Encounter (Signed)
I have reviewed the patient's chart.he apparently has not had recurrent stroke or TIA since 2014. He may hold the Plavix for 5 days for elective colonoscopy and restarted after the procedure when safe with a small but acceptable. periprocedural risk of TIA/stroke if patient is willing

## 2017-12-10 NOTE — Telephone Encounter (Signed)
Please disregard- Sent in error

## 2017-12-10 NOTE — Patient Instructions (Addendum)
If you are age 80 or older, your body mass index should be between 23-30. Your Body mass index is 29.92 kg/m. If this is out of the aforementioned range listed, please consider follow up with your Primary Care Provider.  If you are age 70 or younger, your body mass index should be between 19-25. Your Body mass index is 29.92 kg/m. If this is out of the aformentioned range listed, please consider follow up with your Primary Care Provider.   You have been scheduled for a colonoscopy. Please follow written instructions given to you at your visit today.  Please pick up your prep supplies at the pharmacy within the next 1-3 days. If you use inhalers (even only as needed), please bring them with you on the day of your procedure. Your physician has requested that you go to www.startemmi.com and enter the access code given to you at your visit today. This web site gives a general overview about your procedure. However, you should still follow specific instructions given to you by our office regarding your preparation for the procedure.  You will be contacted by our office prior to your procedure for directions on holding your Plavix.  If you do not hear from our office 1 week prior to your scheduled procedure, please call [phone] to discuss.    Thank you for entrusting me with your care and for choosing [facility], Dr. [place] Cellar

## 2017-12-10 NOTE — Telephone Encounter (Signed)
   Jason [name] August 01, 1935 [id number]  Dear [name]:  We have scheduled the above named patient for a(n) colonoscopy procedure. Our records show that (s)he is on anticoagulation therapy.  Please advise as to whether the patient may come off their therapy of Plavix 5 days days prior to their procedure which is scheduled for 02-17-18.  Please route your response to Lemar Lofty, CMA or fax response to [phone].  Sincerely,    [facility]

## 2017-12-10 NOTE — Progress Notes (Signed)
HPI :  82 y/o Jason [name] with a history of TIA on Plavix, HTN, CKD, here to discuss if he wishes to have any further colonoscopy exams.  His last colonoscopy was about 5 years ago, he had one small sessile serrated polyp and another hyperplastic polyp. He denies any bowel habit changes. He denies any blood in the stools. No abdominal pains. No weight loss. No family history of colon cancer.  He had a history of a TIA in 2011 and has been on Plavix since that time. He denies any recurrent TIA or stroke. He denies any cardiopulmonary symptoms, no chest pains or shortness of breath. He recovered from TIA and has no functional deficits. He endorses normal activities of daily life and is not limited in anyway.   Colonoscopy 10/19/2012 - 2 polyps, diverticulosis, sessile serrated / hyperplastic - good prep Colonoscopy 09/08/2002 - normal  Past Medical History:  Diagnosis Date  . Allergic rhinitis, cause unspecified   . Chronic kidney disease   . GERD (gastroesophageal reflux disease)   . Headache(784.0)   . Hypertension    stress test 5 years ago, eccho 9/11 on chart, Dr. Rayann Heman  . Organic insomnia, unspecified   . Pure hyperglyceridemia   . Recurrent upper respiratory infection (URI)    states chest congestion x 3-4 weeks, no fever- states was productive small amt yellow phlegm, now is clear  . Stroke [facility])    left basal ganglia infarction 9/11- LOV DR [name] 7/12 on chart  . Unspecified cerebral artery occlusion with cerebral infarction   . Unspecified transient cerebral ischemia 02/2010     Past Surgical History:  Procedure Laterality Date  . CHOLECYSTECTOMY  06/17/2012   Procedure: LAPAROSCOPIC CHOLECYSTECTOMY;  Surgeon: Ralene Ok, MD;  Location: [facility];  Service: General;  Laterality: N/A;  . COLONOSCOPY    . JOINT REPLACEMENT  2008   right total knee replacement x 2  . KNEE ARTHROSCOPY Left 07/27/2013   Procedure: LEFT ARTHROSCOPY KNEE, LATERAL AND MEDIAL MENISECTOMY WITH  DEBRIDEMENT;  Surgeon: Kerin Salen, MD;  Location: [facility];  Service: Orthopedics;  Laterality: Left;  . KNEE SURGERY     x2-right  . TONSILLECTOMY    . UMBILICAL HERNIA REPAIR  05/21/2011   Procedure: HERNIA REPAIR UMBILICAL ADULT;  Surgeon: Harl Bowie, MD;  [facility];  Service: General;  Laterality: N/A;   Family History  Problem Relation Age of Onset  . Heart attack Father   . Colon cancer Neg Hx    Social History   Tobacco Use  . Smoking status: Former Smoker    Packs/day: 2.00    Years: 10.00    Pack years: 20.00    Last attempt to quit: 07/21/1973    Years since quitting: 44.4  . Smokeless tobacco: Former Systems developer    Quit date: 06/23/1973  Substance Use Topics  . Alcohol use: Yes    Alcohol/week: 3.6 oz    Types: 3 Glasses of wine, 3 Shots of liquor per week    Comment: occ to daily  . Drug use: No    Types: Other-see comments   Current Outpatient Medications  Medication Sig Dispense Refill  . clopidogrel (PLAVIX) 75 MG tablet Take 1 tablet (75 mg total) by mouth daily. 90 tablet 1  . simvastatin (ZOCOR) 20 MG tablet Take 20 mg by mouth daily.    Marland Kitchen zolpidem (AMBIEN) 5 MG tablet Take 10 mg by mouth at bedtime as needed. Sleep  No current facility-administered medications for this visit.    No Known Allergies   Review of Systems: All systems reviewed and negative except where noted in HPI.   Hgb of Jason.6 on 07/13/2017 (Careeverywhere)  Physical Exam: BP 110/70   Pulse 76   Ht 5\' 11"  (1.803 m) Comment: Simultaneous filing. User may not have seen previous data.  Wt 214 lb 8 oz (97.3 kg)   BMI 29.92 kg/m  Constitutional: Pleasant,well-developed, Jason [name] in no acute distress. HEENT: Normocephalic and atraumatic. Conjunctivae are normal. No scleral icterus. Neck supple.  Cardiovascular: Normal rate, regular rhythm.  Pulmonary/chest: Effort normal and breath sounds normal. No wheezing, rales or rhonchi. Abdominal: Soft,  nondistended, nontender.  There are no masses palpable. No hepatomegaly. Extremities: no edema Lymphadenopathy: No cervical adenopathy noted. Neurological: Alert and oriented to person place and time. Skin: Skin is warm and dry. No rashes noted. Psychiatric: Normal mood and affect. Behavior is normal.   ASSESSMENT AND PLAN: 82 year old Jason [name] here for new patient visit to assess the following issues:  History of colon polyps / antiplatelet therapy - history of TIA on chronic Plavix use, history of sessile serrated polyp who is due for surveillance at this time. We discussed whether or not he wanted to have any further colonoscopy exams for surveillance in light of his age and comorbidities. We discussed the risks and benefits of colonoscopy, versus the risks of not performing any further exams. To perform a colonoscopy we would recommend holding his Plavix for 5 days to minimize the risk of bleeding from removing polyps. If he were to hold his Plavix during this time he would be okay to take an aspirin. He understands there is risks for recurrent TIA or stroke when doing this. He feels that he is in good shape and very functional at this time, and wanted to proceed with one more colonoscopy exam after having this discussion. We will reach out to his neurologist to see if it's okay to hold his Plavix for 5 days.  Royal Cellar, MD [facility]

## 2017-12-10 NOTE — Telephone Encounter (Signed)
[facility] Pre-operative Risk Assessment     Request for surgical clearance:     Endoscopy Procedure  What type of surgery is being performed?     colonoscopy  When is this surgery scheduled?     02-17-18  What type of clearance is required ?   Pharmacy  Are there any medications that need to be held prior to surgery and how long? Plavix, 5 days  Practice name and name of physician performing surgery?      [facility]  What is your office phone and fax number?      Phone- [phone]  [phone]  Anesthesia type (None, local, MAC, general) ?       MAC

## 2017-12-11 NOTE — Telephone Encounter (Signed)
Called and spoke to pt.  He expressed understanding.  Will stop taking Plavix after Aug 22 and will take a baby aspirin on those days. He wrote the date down,

## 2018-02-03 ENCOUNTER — Encounter: Payer: Self-pay | Admitting: Gastroenterology

## 2018-02-17 ENCOUNTER — Encounter: Payer: Self-pay | Admitting: Gastroenterology

## 2018-02-17 ENCOUNTER — Ambulatory Visit (AMBULATORY_SURGERY_CENTER): Payer: Medicare Other | Admitting: Gastroenterology

## 2018-02-17 VITALS — BP 130/85 | HR 56 | Temp 97.8°F | Resp 19 | Ht 71.0 in | Wt 214.0 lb

## 2018-02-17 DIAGNOSIS — D123 Benign neoplasm of transverse colon: Secondary | ICD-10-CM

## 2018-02-17 DIAGNOSIS — K635 Polyp of colon: Secondary | ICD-10-CM

## 2018-02-17 DIAGNOSIS — Z86010 Personal history of colonic polyps: Secondary | ICD-10-CM | POA: Diagnosis present

## 2018-02-17 DIAGNOSIS — D120 Benign neoplasm of cecum: Secondary | ICD-10-CM

## 2018-02-17 MED ORDER — SODIUM CHLORIDE 0.9 % IV SOLN
500.0000 mL | Freq: Once | INTRAVENOUS | Status: DC
Start: 2018-02-17 — End: 2020-02-10

## 2018-02-17 NOTE — Op Note (Signed)
[facility] Patient Name: Jason [name] Procedure Date: 02/17/2018 9:50 AM MRN: [id number] Endoscopist: Remo Lipps P. Havery Moros , MD Age: 82 Referring MD:  Date of Birth: [birth year] Gender: Male Account #: [id number] Procedure:                Colonoscopy Indications:              High risk colon cancer surveillance: Personal                            history of colonic polyps Medicines:                Monitored Anesthesia Care Procedure:                Pre-Anesthesia Assessment:                           - Prior to the procedure, a History and Physical                            was performed, and patient medications and                            allergies were reviewed. The patient's tolerance of                            previous anesthesia was also reviewed. The risks                            and benefits of the procedure and the sedation                            options and risks were discussed with the patient.                            All questions were answered, and informed consent                            was obtained. Prior Anticoagulants: The patient has                            taken Plavix (clopidogrel), last dose was 5 days                            prior to procedure. ASA Grade Assessment: III - A                            patient with severe systemic disease. After                            reviewing the risks and benefits, the patient was                            deemed in satisfactory condition to undergo the  procedure.                           After obtaining informed consent, the colonoscope                            was passed under direct vision. Throughout the                            procedure, the patient's blood pressure, pulse, and                            oxygen saturations were monitored continuously. The                            Colonoscope was introduced through the anus and           advanced to the the cecum, identified by                            appendiceal orifice and ileocecal valve. The                            colonoscopy was performed without difficulty. The                            patient tolerated the procedure well. The quality                            of the bowel preparation was good. The ileocecal                            valve, appendiceal orifice, and rectum were                            photographed. Scope In: 9:57:11 AM Scope Out: 10:15:08 AM Scope Withdrawal Time: 0 hours 12 minutes 36 seconds  Total Procedure Duration: 0 hours 17 minutes 57 seconds  Findings:                 The perianal and digital rectal examinations were                            normal.                           Three sessile polyps were found in the cecum. The                            polyps were 2 to 4 mm in size. These polyps were                            removed with a cold snare. Resection and retrieval                            were complete.  A 5 mm polyp was found in the transverse colon. The                            polyp was sessile. The polyp was removed with a                            cold snare. Resection and retrieval were complete.                           Multiple medium-mouthed diverticula were found in                            the sigmoid colon.                           Internal hemorrhoids were found during                            retroflexion. The hemorrhoids were small.                           The exam was otherwise without abnormality. Complications:            No immediate complications. Estimated blood loss:                            Minimal. Estimated Blood Loss:     Estimated blood loss was minimal. Impression:               - Three 2 to 4 mm polyps in the cecum, removed with                            a cold snare. Resected and retrieved.                           - One 5 mm polyp in  the transverse colon, removed                            with a cold snare. Resected and retrieved.                           - Diverticulosis in the sigmoid colon.                           - Internal hemorrhoids.                           - The examination was otherwise normal. Recommendation:           - Patient has a contact number available for                            emergencies. The signs and symptoms of potential  delayed complications were discussed with the                            patient. Return to normal activities tomorrow.                            Written discharge instructions were provided to the                            patient.                           - Resume previous diet.                           - Continue present medications.                           - Resume Plavix tomorrow                           - Await pathology results.                           - No further surveillance colonoscopy is warranted                            given age and no high risk lesions on this exam Jason [name]. Jason Eddings, MD 02/17/2018 10:19:02 AM This report has been signed electronically.

## 2018-02-17 NOTE — Progress Notes (Signed)
Called to room to assist during endoscopic procedure.  Patient ID and intended procedure confirmed with present staff. Received instructions for my participation in the procedure from the performing physician.  

## 2018-02-17 NOTE — Patient Instructions (Signed)
YOU HAD AN ENDOSCOPIC PROCEDURE TODAY AT [facility]:   Refer to the procedure report that was given to you for any specific questions about what was found during the examination.  If the procedure report does not answer your questions, please call your gastroenterologist to clarify.  If you requested that your care partner not be given the details of your procedure findings, then the procedure report has been included in a sealed envelope for you to review at your convenience later.  YOU SHOULD EXPECT: Some feelings of bloating in the abdomen. Passage of more gas than usual.  Walking can help get rid of the air that was put into your GI tract during the procedure and reduce the bloating. If you had a lower endoscopy (such as a colonoscopy or flexible sigmoidoscopy) you may notice spotting of blood in your stool or on the toilet paper. If you underwent a bowel prep for your procedure, you may not have a normal bowel movement for a few days.  Please Note:  You might notice some irritation and congestion in your nose or some drainage.  This is from the oxygen used during your procedure.  There is no need for concern and it should clear up in a day or so.  SYMPTOMS TO REPORT IMMEDIATELY:   Following lower endoscopy (colonoscopy or flexible sigmoidoscopy):  Excessive amounts of blood in the stool  Significant tenderness or worsening of abdominal pains  Swelling of the abdomen that is new, acute  Fever of 100F or higher  For urgent or emergent issues, a gastroenterologist can be reached at any hour by calling [phone].   DIET:  We do recommend a small meal at first, but then you may proceed to your regular diet.  Drink plenty of fluids but you should avoid alcoholic beverages for 24 hours.  MEDICATIONS: Continue present medications. Resume Plavix tomorrow.  Please see handouts given to you by your recovery nurse.  ACTIVITY:  You should plan to take it easy for the rest of  today and you should NOT DRIVE or use heavy machinery until tomorrow (because of the sedation medicines used during the test).    FOLLOW UP: Our staff will call the number listed on your records the next business day following your procedure to check on you and address any questions or concerns that you may have regarding the information given to you following your procedure. If we do not reach you, we will leave a message.  However, if you are feeling well and you are not experiencing any problems, there is no need to return our call.  We will assume that you have returned to your regular daily activities without incident.  If any biopsies were taken you will be contacted by phone or by letter within the next 1-3 weeks.  Please call us at [phone] if you have not heard about the biopsies in 3 weeks.   Thank you for allowing [place] to provide for your healthcare needs today.  SIGNATURES/CONFIDENTIALITY: You and/or your care partner have signed paperwork which will be entered into your electronic medical record.  These signatures attest to the fact that that the information above on your After Visit Summary has been reviewed and is understood.  Full responsibility of the confidentiality of this discharge information lies with you and/or your care-partner.

## 2018-02-17 NOTE — Progress Notes (Signed)
Pt's states no medical or surgical changes since previsit or office visit. 

## 2018-02-17 NOTE — Progress Notes (Signed)
Report to PACU, RN, vss, BBS= Clear.  

## 2018-02-18 ENCOUNTER — Telehealth: Payer: Self-pay

## 2018-02-18 NOTE — Telephone Encounter (Signed)
  Follow up Call-  Call back number 02/17/2018  Post procedure Call Back phone  # [phone]  Permission to leave phone message Yes  Some recent data might be hidden     Patient questions:  Do you have a fever, pain , or abdominal swelling? No. Pain Score  0 *  Have you tolerated food without any problems? Yes.    Have you been able to return to your normal activities? Yes.    Do you have any questions about your discharge instructions: Diet   No. Medications  No. Follow up visit  No.  Do you have questions or concerns about your Care? No.  Actions: * If pain score is 4 or above: No action needed, pain <4.

## 2018-02-24 ENCOUNTER — Encounter: Payer: Self-pay | Admitting: Gastroenterology

## 2019-12-20 ENCOUNTER — Ambulatory Visit: Payer: Medicare Other | Admitting: Gastroenterology

## 2020-02-10 ENCOUNTER — Telehealth: Payer: Self-pay

## 2020-02-10 ENCOUNTER — Ambulatory Visit: Payer: Medicare Other | Admitting: Gastroenterology

## 2020-02-10 ENCOUNTER — Encounter: Payer: Self-pay | Admitting: Gastroenterology

## 2020-02-10 VITALS — BP 128/70 | HR 68 | Ht 71.0 in | Wt 213.6 lb

## 2020-02-10 DIAGNOSIS — R1310 Dysphagia, unspecified: Secondary | ICD-10-CM

## 2020-02-10 DIAGNOSIS — Z7902 Long term (current) use of antithrombotics/antiplatelets: Secondary | ICD-10-CM

## 2020-02-10 DIAGNOSIS — Z8673 Personal history of transient ischemic attack (TIA), and cerebral infarction without residual deficits: Secondary | ICD-10-CM

## 2020-02-10 NOTE — Telephone Encounter (Signed)
° °  Jason [name] 1936-04-19 [id number]  Dear Dr. Coletta Memos:  We have scheduled the above named patient for a(n) EGD with dilation procedure. Our records show that (s)he is on anticoagulation therapy.  Please advise as to whether the patient may come off their therapy of PLAVIX 5 days prior to their procedure which is scheduled for 02-22-20.  Please route your response to Tia Alert, [place] or fax response to [phone].  Sincerely,    [facility]

## 2020-02-10 NOTE — Progress Notes (Signed)
HPI :  84 y/o male with a history of TIA on Plavix, HTN, CKD, colon polyps, here for a follow up visit for symptoms of dysphagia.    He states he has had dysphagia bothering him for the past year or so.  This occurs intermittently, it occurs less frequently now that he has been chewing his food much better and trying to avoid trigger foods that cause the problem.  He states he has episodes of where food will get stuck in his middle to lower chest and has a hard time going down.  He states he leans over to try to regurgitate out the food to relieve the obstruction.  This mostly occurs with solids, is particularly steak/meat and chicken.  He does not have dysphagia to pills or liquids.  He has occasional heartburn but not significant, this does not occur frequently.  No odynophagia.  No nausea or vomiting.  No abdominal pains.  His weight has been stable.  His TIA was in 2011.  He states he has not had any neurologic issues previously.  He has held Plavix in the past for other procedures.  He had a colonoscopy with me in 2019, had one precancerous polyp and we decided that given his age no further surveillance was recommended.  He does not think he has had a prior EGD.  He has no family history of esophageal cancer.  Prior to the past year he never had problems like this.  His last colonoscopy was about 5 years ago, he had one small sessile serrated polyp and another hyperplastic polyp. He denies any bowel habit changes. He denies any blood in the stools. No abdominal pains. No weight loss. No family history of colon cancer.  He had a history of a TIA in 2011 and has been on Plavix since that time. He denies any recurrent TIA or stroke. He denies any cardiopulmonary symptoms, no chest pains or shortness of breath. He recovered from TIA and has no functional deficits. He endorses normal activities of daily life and is not limited in anyway.   Colonoscopy 10/19/2012 - 2 polyps, diverticulosis, sessile  serrated / hyperplastic - good prep Colonoscopy 09/08/2002 - normal  Colonoscopy 02/17/18 - - The perianal and digital rectal examinations were normal. - Three sessile polyps were found in the cecum. The polyps were 2 to 4 mm in size. These polyps were removed with a cold snare. Resection and retrieval were complete. - A 5 mm polyp was found in the transverse colon. The polyp was sessile. The polyp was removed with a cold snare. Resection and retrieval were complete. - Multiple medium-mouthed diverticula were found in the sigmoid colon. - Internal hemorrhoids were found during retroflexion. The hemorrhoids were small. - The exam was otherwise without abnormality.  Surgical [P], cecum, transverse, polyp (4) - SESSILE SERRATED POLYP WITHOUT CYTOLOGIC DYSPLASIA. - MULTIPLE FRAGMENTS OF BENIGN POLYPOID COLORECTAL MUCOSA.  No further exams due to age    Past Medical History:  Diagnosis Date  . Allergic rhinitis, cause unspecified   . BPH associated with nocturia   . Chronic kidney disease    stage IV  . GERD (gastroesophageal reflux disease)   . Headache(784.0)   . Hypercholesterolemia   . Hypertension    stress test 5 years ago, eccho 9/11 on chart, Dr. Rayann Heman  . Organic insomnia, unspecified   . Pure hyperglyceridemia   . Recurrent upper respiratory infection (URI)    states chest congestion x 3-4 weeks, no fever- states  was productive small amt yellow phlegm, now is clear  . Stroke [facility])    left basal ganglia infarction 9/11- LOV DR [name] 7/12 on chart  . Unspecified cerebral artery occlusion with cerebral infarction   . Unspecified transient cerebral ischemia 02/2010  . Ventral hernia 10/15/2009     Past Surgical History:  Procedure Laterality Date  . CHOLECYSTECTOMY  06/17/2012   Procedure: LAPAROSCOPIC CHOLECYSTECTOMY;  Surgeon: Ralene Ok, MD;  Location: [place] [facility];  Service: General;  Laterality: N/A;  . COLONOSCOPY    . JOINT REPLACEMENT  2008   right total knee  replacement x 2  . KNEE ARTHROSCOPY Left 07/27/2013   Procedure: LEFT arthroscopy KNEE, LATERAL AND MEDIAL MENISECTOMY WITH DEBRIDEMENT;  Surgeon: Kerin Salen, MD;  Location: [place];  Service: Orthopedics;  Laterality: Left;  . KNEE SURGERY     x2-right  . TONSILLECTOMY    . UMBILICAL HERNIA REPAIR  05/21/2011   Procedure: HERNIA REPAIR UMBILICAL ADULT;  Surgeon: Harl Bowie, MD;  Location: [facility];  Service: General;  Laterality: N/A;   Family History  Problem Relation Age of Onset  . Heart attack Father   . Colon cancer Neg Hx   . Esophageal cancer Neg Hx   . Stomach cancer Neg Hx   . Liver disease Neg Hx    Social History   Tobacco Use  . Smoking status: Former Smoker    Packs/day: 2.00    Years: 10.00    Pack years: 20.00    Quit date: 07/21/1973    Years since quitting: 46.5  . Smokeless tobacco: Former Systems developer    Quit date: 06/23/1973  Vaping Use  . Vaping Use: Never used  Substance Use Topics  . Alcohol use: Yes    Comment: 2 drinks nightly  . Drug use: No    Types: Other-see comments   Current Outpatient Medications  Medication Sig Dispense Refill  . clopidogrel (PLAVIX) 75 MG tablet Take 1 tablet (75 mg total) by mouth daily. 90 tablet 1  . doxazosin (CARDURA) 4 MG tablet Take 4 mg by mouth daily.    . simvastatin (ZOCOR) 20 MG tablet Take 20 mg by mouth daily.    Marland Kitchen zolpidem (AMBIEN) 5 MG tablet Take 10 mg by mouth at bedtime as needed. Sleep      No current facility-administered medications for this visit.   No Known Allergies   Review of Systems: All systems reviewed and negative except where noted in HPI.   No recent labs in Epic  Physical Exam: BP 128/70   Pulse 68   Ht 5\' 11"  (1.803 m)   Wt 213 lb 9.6 oz (96.9 kg)   BMI 29.79 kg/m  Constitutional: Pleasant,well-developed, male in no acute distress. Cardiovascular: Normal rate, regular rhythm.  Pulmonary/chest: Effort normal and breath sounds normal.  Abdominal: Soft,  nondistended, nontender.  There are no masses palpable.  Extremities: no edema Lymphadenopathy: No cervical adenopathy noted. Neurological: Alert and oriented to person place and time. Skin: Skin is warm and dry. No rashes noted. Psychiatric: Normal mood and affect. Behavior is normal.   ASSESSMENT AND PLAN: 84 year old male here for reassessment of the following:  Dysphagia / antiplatelet use / history of TIA - as above, intermittent solid food dysphagia for the past year.  Discussed differential diagnosis with him, suspect stricture however need to also rule out mass lesion etc.  I discussed options for evaluation to include EGD versus barium swallow.  I discussed risk/benefits of  each.  Given his symptoms I do think EGD would be best to evaluate this, assess for stricture and dilate if amenable to treat his symptoms, ensure no evidence of mass lesion.  We discussed that if we are to pursue EGD with possible dilation he will need to hold Plavix for 5 days.  He has done this in the past without problems.  I will reach out to his prescribing provider to see if that is okay.  It is okay if he takes a baby aspirin in its place if he wishes to do this.  He understands very small risk for recurrent TIA while holding Plavix.  Following discussion of risks and benefits of endoscopy he wanted to proceed.  Further recommendations pending the results.  [place] Cellar, MD [facility]  CC: Bernerd Limbo, MD

## 2020-02-10 NOTE — Telephone Encounter (Signed)
Anti coag letter faxed to Dr. Chales Abrahams office at [phone]

## 2020-02-10 NOTE — Patient Instructions (Signed)
If you are age 82 or older, your body mass index should be between 23-30. Your Body mass index is 29.79 kg/m. If this is out of the aforementioned range listed, please consider follow up with your Primary Care Provider.  If you are age 57 or younger, your body mass index should be between 19-25. Your Body mass index is 29.79 kg/m. If this is out of the aformentioned range listed, please consider follow up with your Primary Care Provider.   You have been scheduled for an endoscopy. Please follow written instructions given to you at your visit today. If you use inhalers (even only as needed), please bring them with you on the day of your procedure.   You will be contacted by our office prior to your procedure for directions on holding your Plavix.  If you do not hear from our office 1 week prior to your scheduled procedure, please call [phone] to discuss.   Thank you for entrusting me with your care and for choosing [facility], Dr. Othello Cellar

## 2020-02-13 NOTE — Telephone Encounter (Signed)
Called and spoke to Dr. Chales Abrahams office at [phone].  They asked that we refax the request.  Refaxed to [phone]

## 2020-02-15 NOTE — Telephone Encounter (Signed)
Called and spoke to [place] at Dr. Chales Abrahams office.  Dr. Hanley Seamen verbal clearance for pt to hold Plavix for 5 days prior to procedure.  She will fax over a written approval.  Called and spoke to pt.  He understands to take tomorrow Thursday, but to hold it starting on Friday the 27th until the procedure.

## 2020-02-15 NOTE — Telephone Encounter (Signed)
received a faxed written approval from Dr. Chales Abrahams office.  Scanned to chart.

## 2020-02-21 ENCOUNTER — Encounter: Payer: Self-pay | Admitting: Certified Registered Nurse Anesthetist

## 2020-02-22 ENCOUNTER — Encounter: Payer: Self-pay | Admitting: Gastroenterology

## 2020-02-22 ENCOUNTER — Ambulatory Visit (AMBULATORY_SURGERY_CENTER): Payer: Medicare Other | Admitting: Gastroenterology

## 2020-02-22 ENCOUNTER — Other Ambulatory Visit: Payer: Self-pay

## 2020-02-22 VITALS — BP 117/84 | HR 55 | Temp 97.9°F | Resp 11 | Ht 71.0 in | Wt 213.0 lb

## 2020-02-22 DIAGNOSIS — K31819 Angiodysplasia of stomach and duodenum without bleeding: Secondary | ICD-10-CM

## 2020-02-22 DIAGNOSIS — K222 Esophageal obstruction: Secondary | ICD-10-CM | POA: Diagnosis not present

## 2020-02-22 DIAGNOSIS — R1310 Dysphagia, unspecified: Secondary | ICD-10-CM | POA: Diagnosis not present

## 2020-02-22 MED ORDER — SODIUM CHLORIDE 0.9 % IV SOLN: 500.0000 mL | Freq: Once | INTRAVENOUS | Status: DC

## 2020-02-22 NOTE — Op Note (Signed)
[facility] Patient Name: Jason [name] Procedure Date: 02/22/2020 4:11 PM MRN: [id number] Endoscopist: Remo Lipps P. Havery Moros , MD Age: 84 Referring MD:  Date of Birth: 08/07/1935 Gender: Male Account #: [id number] Procedure:                Upper GI endoscopy Indications:              Dysphagia Medicines:                Monitored Anesthesia Care Procedure:                Pre-Anesthesia Assessment:                           - Prior to the procedure, a History and Physical                            was performed, and patient medications and                            allergies were reviewed. The patient's tolerance of                            previous anesthesia was also reviewed. The risks                            and benefits of the procedure and the sedation                            options and risks were discussed with the patient.                            All questions were answered, and informed consent                            was obtained. Prior Anticoagulants: The patient has                            taken Plavix (clopidogrel), last dose was 5 days                            prior to procedure. ASA Grade Assessment: III - A                            patient with severe systemic disease. After                            reviewing the risks and benefits, the patient was                            deemed in satisfactory condition to undergo the                            procedure.  After obtaining informed consent, the endoscope was                            passed under direct vision. Throughout the                            procedure, the patient's blood pressure, pulse, and                            oxygen saturations were monitored continuously. The                            Endoscope was introduced through the mouth, and                            advanced to the second part of duodenum. The upper                             GI endoscopy was accomplished without difficulty.                            The patient tolerated the procedure well. Scope In: Scope Out: Findings:                 Esophagogastric landmarks were identified: the                            Z-line was found at 44 cm, the gastroesophageal                            junction was found at 44 cm and the upper extent of                            the gastric folds was found at 44 cm from the                            incisors.                           A benign-appearing, intrinsic moderate stenoses was                            found 40 cm from the incisors. It was dilated using                            the endoscope itself during intubation of the                            stomach. There was another benign moderate stenoses                            just proximal to this at 39cm from the incisors.  There was a small diverticulum noted just lateral                            and above to this stenosis. The distal esophagus                            was extremely spastic, question possible underlying                            motility disorder. There was another suspected                            small diverticulum 1-2 cm proximal to the proximal                            stenosis. No further dilation performed given                            moderate mucosal wrent with the endoscope itself                           The exam of the esophagus was otherwise normal.                           The entire examined stomach was normal.                           A single small angiodysplastic lesion was found in                            the duodenal bulb.                           The exam of the duodenum was otherwise normal. Complications:            No immediate complications. Estimated blood loss:                            Minimal. Estimated Blood Loss:     Estimated blood loss was minimal. Impression:                - Esophagogastric landmarks identified.                           - 2 benign-appearing esophageal stenoses, one                            associated with a diverticulum, spastic distal                            esophagus. Distal stenosis dilated with the                            endoscope itself, no further dilation performed  given this finding.                           - Normal stomach.                           - Normal duodenal bulb and second portion of the                            duodenum. Recommendation:           - Patient has a contact number available for                            emergencies. The signs and symptoms of potential                            delayed complications were discussed with the                            patient. Return to normal activities tomorrow.                            Written discharge instructions were provided to the                            patient.                           - Post dilation diet                           - Would avoid meats / chicken to prevent impaction                           - Continue present medications.                           - Resume Plavix tomorrow                           - Recommend barium swallow to further evaluate                            distal esophagus / motility, anticipate plans for                            another EGD to further dilate strictures Remo Lipps P. [name], MD 02/22/2020 4:48:03 PM This report has been signed electronically.

## 2020-02-22 NOTE — Patient Instructions (Signed)
Post dilation diet - see handout given to you.  Avoid meats and chicken to prevent impaction.  Resume Plavix tomorrow.  Continue present medications.  Recommend barium swallow for further evaluation.  Someone will call you to set up this procedure.  YOU HAD AN ENDOSCOPIC PROCEDURE TODAY AT [facility]:   Refer to the procedure report that was given to you for any specific questions about what was found during the examination.  If the procedure report does not answer your questions, please call your gastroenterologist to clarify.  If you requested that your care partner not be given the details of your procedure findings, then the procedure report has been included in a sealed envelope for you to review at your convenience later.  YOU SHOULD EXPECT: Some feelings of bloating in the abdomen. Passage of more gas than usual.  Walking can help get rid of the air that was put into your GI tract during the procedure and reduce the bloating. If you had a lower endoscopy (such as a colonoscopy or flexible sigmoidoscopy) you may notice spotting of blood in your stool or on the toilet paper. If you underwent a bowel prep for your procedure, you may not have a normal bowel movement for a few days.  Please Note:  You might notice some irritation and congestion in your nose or some drainage.  This is from the oxygen used during your procedure.  There is no need for concern and it should clear up in a day or so.  SYMPTOMS TO REPORT IMMEDIATELY:    Following upper endoscopy (EGD)  Vomiting of blood or coffee ground material  New chest pain or pain under the shoulder blades  Painful or persistently difficult swallowing  New shortness of breath  Fever of 100F or higher  Black, tarry-looking stools  For urgent or emergent issues, a gastroenterologist can be reached at any hour by calling [phone]. Do not use MyChart messaging for urgent concerns.    DIET:  We do recommend a small  meal at first, but then you may proceed to your regular diet.  Drink plenty of fluids but you should avoid alcoholic beverages for 24 hours.  ACTIVITY:  You should plan to take it easy for the rest of today and you should NOT DRIVE or use heavy machinery until tomorrow (because of the sedation medicines used during the test).    FOLLOW UP: Our staff will call the number listed on your records 48-72 hours following your procedure to check on you and address any questions or concerns that you may have regarding the information given to you following your procedure. If we do not reach you, we will leave a message.  We will attempt to reach you two times.  During this call, we will ask if you have developed any symptoms of COVID 19. If you develop any symptoms (ie: fever, flu-like symptoms, shortness of breath, cough etc.) before then, please call [phone].  If you test positive for Covid 19 in the 2 weeks post procedure, please call and report this information to [place].    If any biopsies were taken you will be contacted by phone or by letter within the next 1-3 weeks.  Please call us at [phone] if you have not heard about the biopsies in 3 weeks.    SIGNATURES/CONFIDENTIALITY: You and/or your care partner have signed paperwork which will be entered into your electronic medical record.  These signatures attest to the fact that that  the information above on your After Visit Summary has been reviewed and is understood.  Full responsibility of the confidentiality of this discharge information lies with you and/or your care-partner.

## 2020-02-22 NOTE — Progress Notes (Signed)
AR - front desk  CW- VS

## 2020-02-22 NOTE — Progress Notes (Signed)
Report given to PACU, vss 

## 2020-02-22 NOTE — Progress Notes (Signed)
1620 Robinul 0.1 mg IV given due large amount of secretions upon assessment.  MD made aware, vss 

## 2020-02-23 ENCOUNTER — Other Ambulatory Visit: Payer: Self-pay

## 2020-02-23 ENCOUNTER — Telehealth: Payer: Self-pay

## 2020-02-23 DIAGNOSIS — R1310 Dysphagia, unspecified: Secondary | ICD-10-CM

## 2020-02-23 NOTE — Telephone Encounter (Signed)
-----   Message from Yetta Flock, MD sent at 02/22/2020  4:48 PM EDT ----- Regarding: barium swallow Hi Jason [name],  Mr. Maready had an EGD today and needs a barium swallow ordered (routine) to further evaluate esophageal strictures / dysmotility. Can you call him sometime this week to schedule? Thanks  Dr. Havery Moros

## 2020-02-23 NOTE — Telephone Encounter (Signed)
Spoke with patient in regards with getting him scheduled for the barium swallow study, he stated that he could do Tuesday/Friday afternoons. I called scheduling and they told me that they do not perform this study after 10:30 am. I scheduled patient for 03/06/20 at 10:30 am at [facility] with a 10:15 am arrival time, NPO 3 hours prior. I returned call to patient to provide him with the appt information and I let him know that they do not schedule in the afternoon for this and proceeded to give him the appt information he stated that there was no reason for him to take down the appt information because he knew he couldn't make it, he states that someone watches his wife and they don't come until 10:30 am. I suggested that patient still take down the appt information and told him that I could provide him with the scheduling number so that he could get his appt rescheduled to work around his schedule, he stated that he didn't need to call them because they were going to tell him the same thing that I told him, I reiterated that its possible that he could find another day or time that worked better, pt still did not want to take information down. I explained to him that I am not familiar with his schedule as well as he is and that there may be a later date that he is more flexible with.  Dr. Havery Moros please advise, thank you.

## 2020-02-24 ENCOUNTER — Telehealth: Payer: Self-pay | Admitting: *Deleted

## 2020-02-24 ENCOUNTER — Telehealth: Payer: Self-pay

## 2020-02-24 NOTE — Telephone Encounter (Signed)
°  Follow up Call-  Call back number 02/22/2020 02/17/2018  Post procedure Call Back phone  # [phone]  Permission to leave phone message Yes Yes  Some recent data might be hidden     Patient questions:  Do you have a fever, pain , or abdominal swelling? No. Pain Score  0 *  Have you tolerated food without any problems? Yes.    Have you been able to return to your normal activities? Yes.    Do you have any questions about your discharge instructions: Diet   No. Medications  No. Follow up visit  No.  Do you have questions or concerns about your Care? No.  Actions: * If pain score is 4 or above: No action needed, pain <4.  1. Have you developed a fever since your procedure? no  2.   Have you had an respiratory symptoms (SOB or cough) since your procedure? no  3.   Have you tested positive for COVID 19 since your procedure no  4.   Have you had any family members/close contacts diagnosed with the COVID 19 since your procedure?  no   If yes to any of these questions please route to Joylene [name], RN and Joella Prince, RN

## 2020-02-24 NOTE — Telephone Encounter (Signed)
Spoke with patient and explained that Dr. Havery Moros thinks that this test will be useful to help evaluate and treat his condition and that he also agreed that he should call radiology himself to see if they can accommodate his schedule better, pt advised that if they did not figure out a solution then to give me a call back and let me know. Pt could not write the number down at that time as he was driving. I told patient that I could leave the information on his voicemail, I provided him with central scheduling number and our office number if he had any questions.

## 2020-02-24 NOTE — Telephone Encounter (Signed)
Thanks for letting me know, sorry to hear about this. I think this test would be useful to further evaluate and treat his condition. Sounds like his schedule is quite restricted. I agree that he should call the scheduling center for radiology himself, he can explain his situation and see if they can accommodate his requests to get this done. Can you let him know you spoke with me and I recommend he work out his scheduling issues with them directly. If he is not successful with this he can call us back. Thanks

## 2020-02-24 NOTE — Telephone Encounter (Signed)
Follow up call made. Left message.

## 2020-02-28 NOTE — Telephone Encounter (Signed)
Spoke with patient, patient unsure if he needed a follow up appt. Advised that Dr. Havery Moros wanted him to have the barium swallow study done to further evaluate his condition. I provided patient with central scheduling number to call and have this rescheduled at his convenience, pt states that he doesn't know if he is going to have this done because he doesn't see how it will help, advised that this will further help evaluate his condition and treat it. Answered all of patient's questions.

## 2020-03-06 ENCOUNTER — Other Ambulatory Visit (HOSPITAL_COMMUNITY): Payer: Medicare Other

## 2020-03-20 ENCOUNTER — Ambulatory Visit (HOSPITAL_COMMUNITY): Payer: Medicare Other

## 2020-03-29 ENCOUNTER — Other Ambulatory Visit (HOSPITAL_COMMUNITY): Payer: Medicare Other

## 2020-03-30 ENCOUNTER — Other Ambulatory Visit: Payer: Self-pay

## 2020-03-30 ENCOUNTER — Telehealth: Payer: Self-pay | Admitting: Gastroenterology

## 2020-03-30 ENCOUNTER — Ambulatory Visit (HOSPITAL_COMMUNITY)
Admission: RE | Admit: 2020-03-30 | Discharge: 2020-03-30 | Disposition: A | Discharge status: Home / Self Care | Payer: Medicare Other | Source: Ambulatory Visit | Attending: Gastroenterology | Admitting: Gastroenterology

## 2020-03-30 DIAGNOSIS — R1310 Dysphagia, unspecified: Secondary | ICD-10-CM | POA: Insufficient documentation

## 2020-03-30 NOTE — Telephone Encounter (Signed)
See result note.  

## 2020-05-08 ENCOUNTER — Ambulatory Visit: Payer: Medicare Other | Admitting: Gastroenterology

## 2020-05-29 ENCOUNTER — Encounter: Payer: Self-pay | Admitting: Gastroenterology

## 2020-05-29 ENCOUNTER — Ambulatory Visit: Payer: Medicare Other | Admitting: Gastroenterology

## 2020-05-29 VITALS — BP 108/58 | HR 56 | Ht 71.0 in | Wt 218.0 lb

## 2020-05-29 DIAGNOSIS — R933 Abnormal findings on diagnostic imaging of other parts of digestive tract: Secondary | ICD-10-CM

## 2020-05-29 DIAGNOSIS — R1310 Dysphagia, unspecified: Secondary | ICD-10-CM

## 2020-05-29 NOTE — Patient Instructions (Addendum)
If you are age 82 or older, your body mass index should be between 23-30. Your Body mass index is 30.4 kg/m. If this is out of the aforementioned range listed, please consider follow up with your Primary Care Provider.  If you are age 32 or younger, your body mass index should be between 19-25. Your Body mass index is 30.4 kg/m. If this is out of the aformentioned range listed, please consider follow up with your Primary Care Provider.     Thank you for entrusting me with your care and for choosing [facility], Dr. [place] Cellar

## 2020-05-29 NOTE — Progress Notes (Signed)
HPI :  84 year old male with a history of TIA on Plavix, history of colon polyps, dysphagia, here for follow-up visit for dysphagia.  I saw him back at the end of August for ongoing dysphagia which has been bothering him for some time.  Typically solid food dysphagia, can get stuck in the proximal to mid chest, has had episodic episodes of regurgitation to get out the food that does not pass.  He has not had any trouble with liquids or pills, tends to happen with solids only.  He had an EGD with me on 9/1 that led to subsequent barium study as outlined below:  EGD 02/22/20 -  - A benign-appearing, intrinsic moderate stenoses was found 40 cm from the incisors. It was dilated using the endoscope itself during intubation of the stomach. There was another benign moderate stenoses just proximal to this at 39cm from the incisors. There was a small diverticulum noted just lateral and above to this stenosis. The distal esophagus was extremely spastic, question possible underlying motility disorder. There was another suspected small diverticulum 1-2 cm proximal to the proximal stenosis. No further dilation performed given moderate mucosal wrent with the endoscope itself - The exam of the esophagus was otherwise normal. - The entire examined stomach was normal. - A single small angiodysplastic lesion was found in the duodenal bulb. - The exam of the duodenum was otherwise normal.   Barium swallow 03/30/20 - FINDINGS: Normal swallowing function in the high cervical esophagus.  Contrast flowed readily from the esophagus into the stomach in upright position. Some narrowing of the distal esophagus above the GE junction.  Three diverticulum are noted in the distal third of the esophagus measuring approximately 1 cm each.  With patient in prone positioning, esophageal motility was assessed. There was poor initiation of the primary stripping wave. No tertiary contractions.  A 13 mm barium tablet  did not pass the GE junction and and was retained several cm above the GE junction. Thick and thin barium was administered in attempts to propel the tablet through the GE junction without success.  No gastroesophageal reflux demonstrated during the course exam.  IMPRESSION: 1. Esophageal dysmotility with poor initiation stripping wave. No tertiary contractions 2. 13 mm barium tab failed to pass GE junction and lodged several cm above the GE junction. 3. Three small diverticulum noted in the distal third of the esophagus.   He is here to discuss options moving forward.  He states since the endoscopy his symptoms have essentially been unchanged.  While there was a clear mucosal rent noted with dilation with the endoscope itself he did not notice any clinical difference.  He continues to have dysphagia with most meals.  He eats quite slowly to minimize his symptoms but states he has not really had any episodes of things getting severely stuck.  His wife has dementia and requires long-term care.  He is usually with her most all the time, it is hard for him to get away to medical appointments especially for endoscopy etc.  His daughter is available to help bring him to medical appointments as needed.   Prior endoscopic workup: Colonoscopy 10/19/2012 - 2 polyps, diverticulosis, sessile serrated / hyperplastic - good prep Colonoscopy 09/08/2002 - normal  Colonoscopy 02/17/18 - - The perianal and digital rectal examinations were normal. - Three sessile polyps were found in the cecum. The polyps were 2 to 4 mm in size. These polyps were removed with a cold snare. Resection and retrieval were complete. -  A 5 mm polyp was found in the transverse colon. The polyp was sessile. The polyp was removed with a cold snare. Resection and retrieval were complete. - Multiple medium-mouthed diverticula were found in the sigmoid colon. - Internal hemorrhoids were found during retroflexion. The hemorrhoids were  small. - The exam was otherwise without abnormality.  Surgical [P], cecum, transverse, polyp (4) - SESSILE SERRATED POLYP WITHOUT CYTOLOGIC DYSPLASIA. - MULTIPLE FRAGMENTS OF BENIGN POLYPOID COLORECTAL MUCOSA. - A single small angiodysplastic lesion was found in the duodenal bulb. - The exam of the duodenum was otherwise normal.   Past Medical History:  Diagnosis Date  . Allergic rhinitis, cause unspecified   . BPH associated with nocturia   . Cataract   . Chronic kidney disease    stage IV - 02/22/20- pt says no kidney problems that he is aware of  . GERD (gastroesophageal reflux disease)   . Headache(784.0)   . Hypercholesterolemia   . Hypertension    stress test 5 years ago, eccho 9/11 on chart, Dr. Rayann Heman  . Organic insomnia, unspecified   . Pure hyperglyceridemia   . Recurrent upper respiratory infection (URI)    states chest congestion x 3-4 weeks, no fever- states was productive small amt yellow phlegm, now is clear  . Stroke [facility])    left basal ganglia infarction 9/11- LOV DR [name] 7/12 on chart  . Unspecified cerebral artery occlusion with cerebral infarction   . Unspecified transient cerebral ischemia 02/2010  . Ventral hernia 10/15/2009     Past Surgical History:  Procedure Laterality Date  . CHOLECYSTECTOMY  06/17/2012   Procedure: LAPAROSCOPIC CHOLECYSTECTOMY;  Surgeon: Ralene Ok, MD;  Location: [place];  Service: General;  Laterality: N/A;  . COLONOSCOPY    . JOINT REPLACEMENT  2008   right total knee replacement x 2  . KNEE ARTHROSCOPY Left 07/27/2013   Procedure: LEFT arthroscopy KNEE, LATERAL AND MEDIAL MENISECTOMY WITH DEBRIDEMENT;  Surgeon: Kerin Salen, MD;  Location: [place];  Service: Orthopedics;  Laterality: Left;  . KNEE SURGERY     x2-right  . TONSILLECTOMY    . UMBILICAL HERNIA REPAIR  05/21/2011   Procedure: HERNIA REPAIR UMBILICAL ADULT;  Surgeon: Harl Bowie, MD;  Location: [facility];  Service: General;  Laterality:  N/A;   Family History  Problem Relation Age of Onset  . Heart attack Father   . Colon cancer Neg Hx   . Esophageal cancer Neg Hx   . Stomach cancer Neg Hx   . Liver disease Neg Hx    Social History   Tobacco Use  . Smoking status: Former Smoker    Packs/day: 2.00    Years: 10.00    Pack years: 20.00    Quit date: 07/21/1973    Years since quitting: 46.8  . Smokeless tobacco: Former Systems developer    Quit date: 06/23/1973  Vaping Use  . Vaping Use: Never used  Substance Use Topics  . Alcohol use: Yes    Comment: 2 drinks nightly  . Drug use: No    Types: Other-see comments   Current Outpatient Medications  Medication Sig Dispense Refill  . clopidogrel (PLAVIX) 75 MG tablet Take 1 tablet (75 mg total) by mouth daily. 90 tablet 1  . doxazosin (CARDURA) 4 MG tablet Take 4 mg by mouth daily.    . simvastatin (ZOCOR) 20 MG tablet Take 20 mg by mouth daily.    Marland Kitchen zolpidem (AMBIEN) 5 MG tablet Take 10 mg by mouth  at bedtime as needed. Sleep      No current facility-administered medications for this visit.   No Known Allergies   Review of Systems: All systems reviewed and negative except where noted in HPI.    Physical Exam: BP (!) 108/58 (BP Location: Left Arm, Patient Position: Sitting, Cuff Size: Normal)   Pulse (!) 56 Comment: irregular  Ht 5\' 11"  (1.803 m) Comment: height measured without shoes  Wt 218 lb (98.9 kg)   BMI 30.40 kg/m  Constitutional: Pleasant,well-developed, male in no acute distress. Neurological: Alert and oriented to person place and time. Skin: Skin is warm and dry. No rashes noted. Psychiatric: Normal mood and affect. Behavior is normal.   ASSESSMENT AND PLAN: 84 year old male here for reassessment the following issues:  Dysphagia / esophageal stricture / abnormal barium swallow - history as outlined above, dysphagia that led to EGD in which tight stricture was noted at the GEJ, dilated with the endoscope itself which caused mucosal wrents, as well as  another stenosis slightly proximal to that, and 3 diverticulum in this area.  The distal esophagus also appeared quite spastic, concerning for underlying motility disorder.  Barium swallow shows changes concerning for underlying motility disorder, barium tablet did not pass through the GE J, diverticulum noted.  I discussed differential diagnosis with him.  Achalasia /GEJ outflow obstruction could certainly present like this although he does not have classic appearance for this on barium study.  Discussed with him his options at this time and how aggressive he wanted to be with this given his age and other medical problems.  I relayed to him that if he does not pursue additional therapy it is quite likely that his symptoms will continue to persist and perhaps worsen over time.  Given the findings to date I think esophageal manometry is reasonable, ensure no evidence of achalasia, and clarify the severity of his motility disorder contributing to this.  If he has achalasia could consider Botox injection with larger dilation of the LES.  I discussed what manometry is with him, risk benefits, I think it would be okay to do this on Plavix.  We otherwise discussed if he wanted any further endoscopies at all in general.  I am concerned about his risk for bleeding and potentially perforation with multiple diverticulum in the lower esophagus at the site of the stricture, he is higher than average risk.  He is getting by on a soft diet at this time and really questions if he wants anything else done for this.  He understands he likely will continue to have swallowing difficulty, and at risk for impaction which we discussed at length, and potentially progressive worsening if he has achalasia that goes untreated.  At the same time he is concerned about having additional medical procedures done, it very hard for him to leave his wife and get to medical appointments.  After lengthy discussion about his options, his preference is  to monitor for now and he wants to think about manometry.  If he is going to do anything, his preference is manometry given its safe and he does not require anesthesia and at least we can have a better discussion about his options based on the findings.  I recommend in the interim he stay on a soft diet, avoid chicken meat to prevent impaction, as well as fresh uncooked vegetables.  He will contact me moving forward if he wishes to proceed with any additional evaluation or treatment of this.  Bulverde Cellar, MD  [facility]

## 2020-12-28 IMAGING — RF DG ESOPHAGUS
12 of 14 series · 15 of 24 positions shown · non-contrast
Comparison: None.

CLINICAL DATA: Dysphagia. Feeling of food sticking in the proximal
third of the esophagus. Recent upper endoscopy.

EXAM:
ESOPHOGRAM / BARIUM SWALLOW / BARIUM TABLET STUDY
TECHNIQUE: Combined double contrast and single contrast examination performed
using effervescent crystals, thick barium liquid, and thin barium
liquid. The patient was observed with fluoroscopy swallowing a 13 mm
barium sulphate tablet.
FLUOROSCOPY TIME:  Fluoroscopy Time:  3 minutes 24 seconds
Radiation Exposure Index (if provided by the fluoroscopic device):
Is 125.2 mGy
Number of Acquired Spot Images: Ray

[Series 1: cp_standard · 0.34mm/px · 2 of 124 frames shown (1 of 2)]
[frame 19/124]
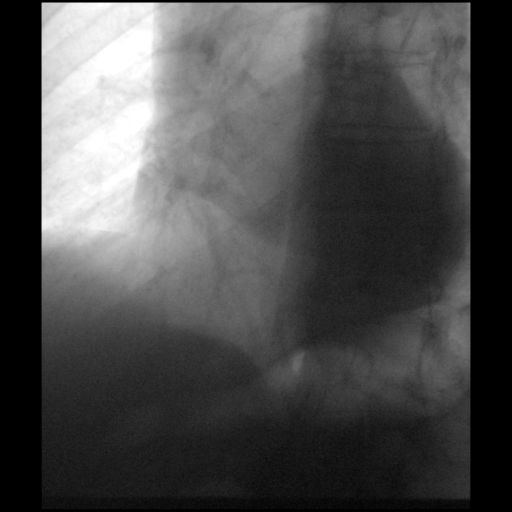
[frame 106/124]
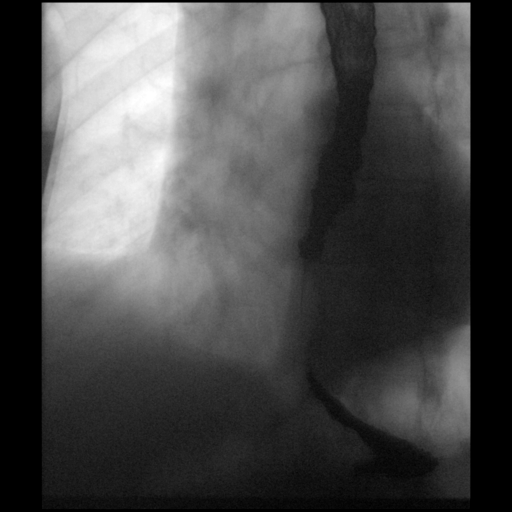

[Series 3: fluoro_barium 2fps_bw · 0.17mm/px · 2 of 2 frames shown (1 of 10)]
[frame 1/2]
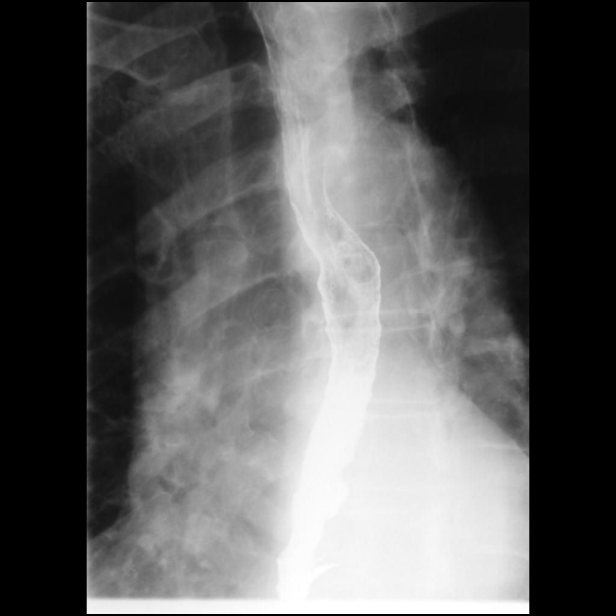
[frame 2/2]
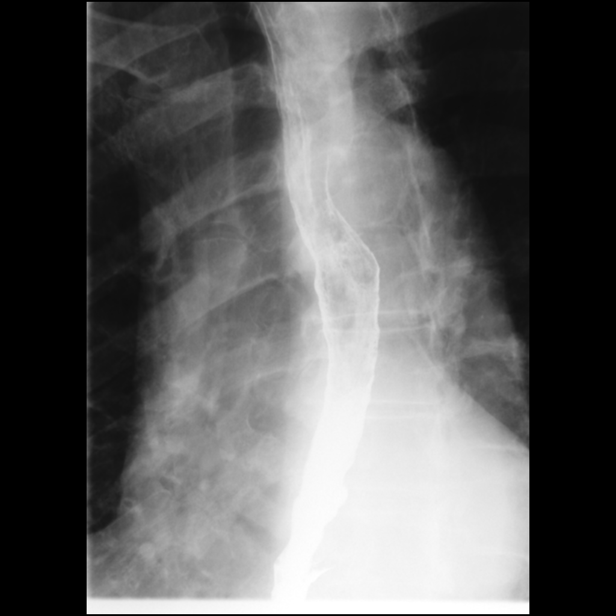

[Series 4: fluoro_barium 2fps_bw · 0.17mm/px · 1 of 3 frames shown (2 of 10)]
[frame 3/3]
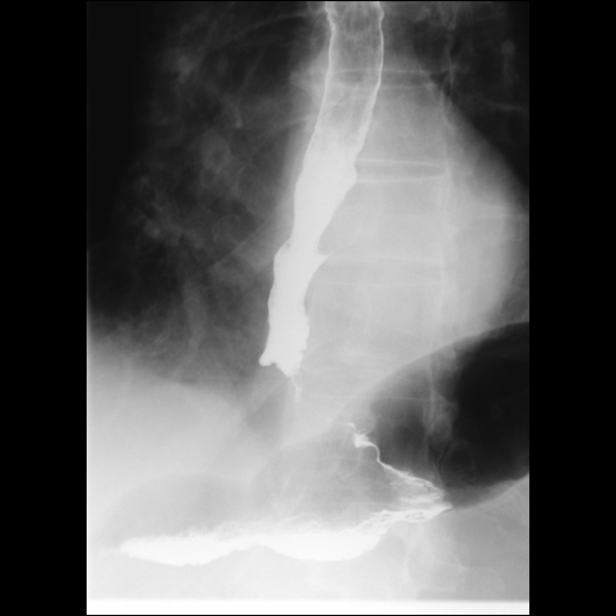

[Series 5: fluoro_barium 2fps_bw · 0.18mm/px · 1 of 2 frames shown (3 of 10)]
[frame 1/2]
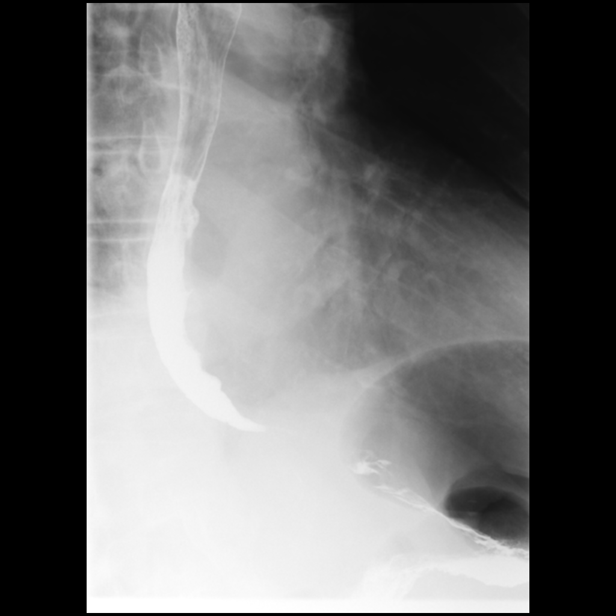

[Series 6: cp_standard · 0.35mm/px · 1 of 171 frames shown (2 of 2)]
[frame 26/171]
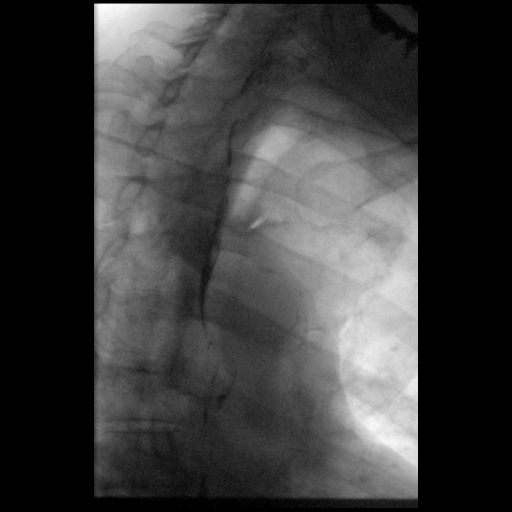

[Series 7: fluoro_barium 2fps_bw · 0.18mm/px · 2 of 3 frames shown (4 of 10)]
[frame 1/3]
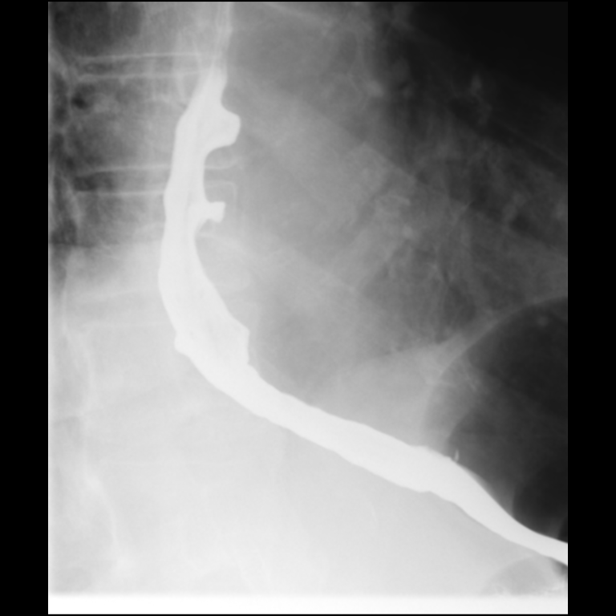
[frame 3/3]
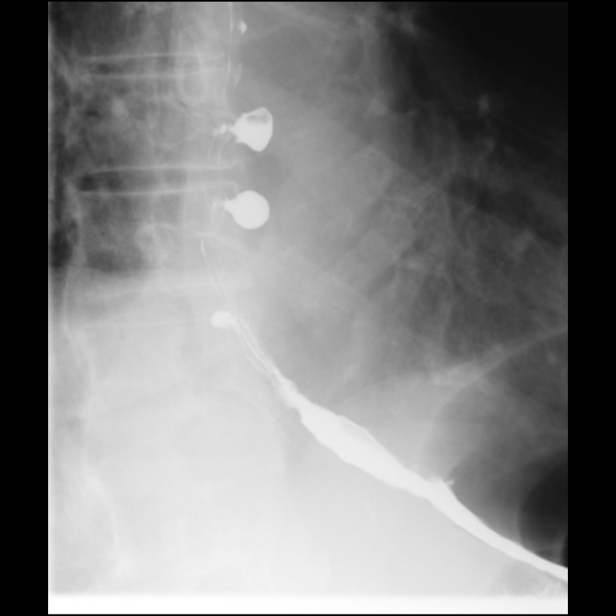

[Series 8: fluoro_barium 2fps_bw · 0.18mm/px · 1 of 3 frames shown (5 of 10)]
[frame 3/3]
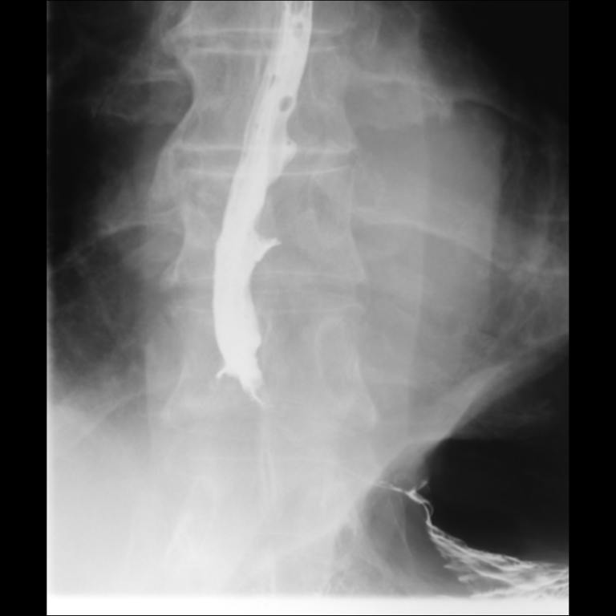

[Series 9: fluoro_barium 2fps_bw · 0.18mm/px · 1 of 1 slices shown (6 of 10)]
[im 1/1]
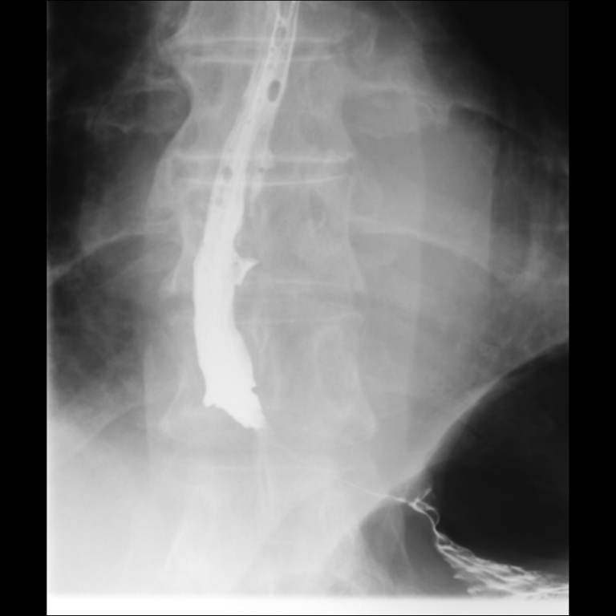

[Series 11: fluoro_barium 2fps_bw · 0.18mm/px · 1 of 2 frames shown (7 of 10)]
[frame 1/2]
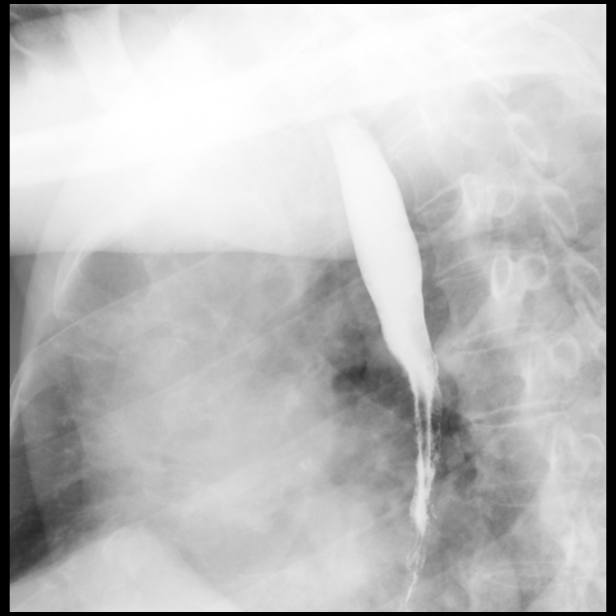

[Series 12: fluoro_barium 2fps_bw · 0.18mm/px · 1 of 2 frames shown (8 of 10)]
[frame 2/2]
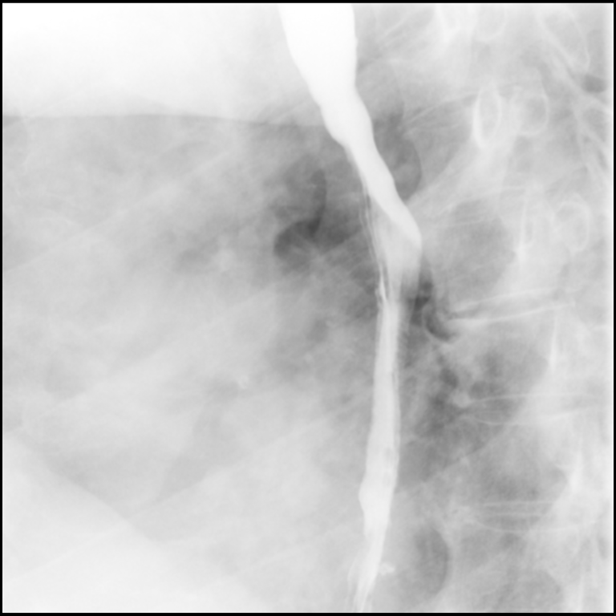

[Series 13: fluoro_barium 2fps_bw · 0.18mm/px · 1 of 2 frames shown (9 of 10)]
[frame 1/2]
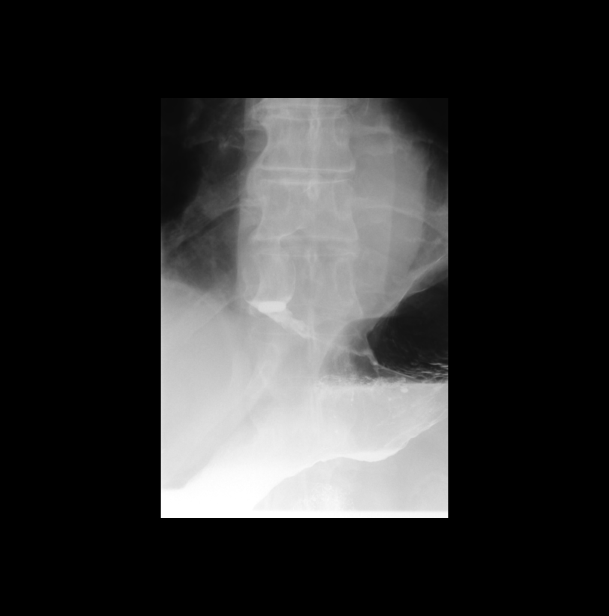

[Series 14: fluoro_barium 2fps_bw · 0.18mm/px · 1 of 2 frames shown (10 of 10)]
[frame 2/2]
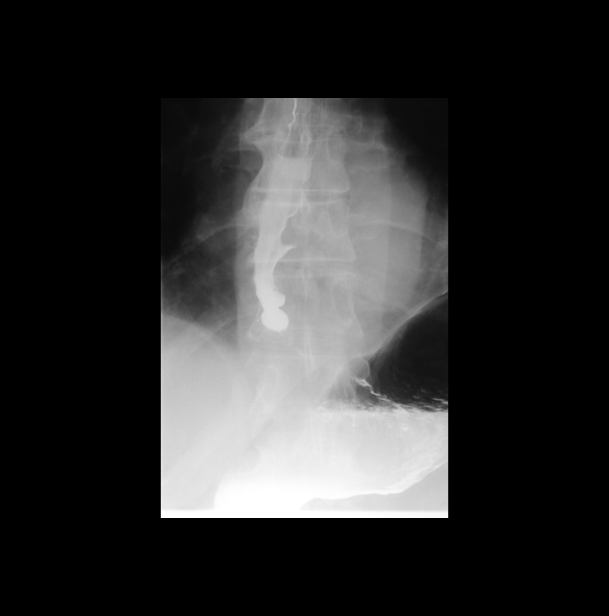

[15 of 24 positions shown; findings below may reference images not displayed]

FINDINGS: Normal swallowing function in the high cervical esophagus.

Contrast flowed readily from the esophagus into the stomach in
upright position. Some narrowing of the distal esophagus above the
GE junction.

Three diverticulum are noted in the distal third of the esophagus
measuring approximately 1 cm each.

With patient in prone positioning, esophageal motility was assessed.
There was poor initiation of the primary stripping wave. No tertiary
contractions.

A 13 mm barium tablet did not pass the GE junction and and was
retained several cm above the GE junction. Thick and thin barium was
administered in attempts to propel the tablet through the GE
junction without success.

No gastroesophageal reflux demonstrated during the course exam.
IMPRESSION: 1. Esophageal dysmotility with poor initiation stripping wave. No
tertiary contractions
2. 13 mm barium tab failed to pass GE junction and lodged several cm
above the GE junction.
3. Three small diverticulum noted in the distal third of the
esophagus.

## 2024-07-07 ENCOUNTER — Emergency Department (HOSPITAL_COMMUNITY)
Admission: EM | Admit: 2024-07-07 | Discharge: 2024-07-07 | Disposition: A | Discharge status: Home / Self Care | Attending: Emergency Medicine | Admitting: Emergency Medicine

## 2024-07-07 ENCOUNTER — Encounter (HOSPITAL_COMMUNITY): Payer: Self-pay

## 2024-07-07 ENCOUNTER — Emergency Department (HOSPITAL_COMMUNITY)

## 2024-07-07 ENCOUNTER — Other Ambulatory Visit: Payer: Self-pay

## 2024-07-07 DIAGNOSIS — S61512A Laceration without foreign body of left wrist, initial encounter: Secondary | ICD-10-CM | POA: Insufficient documentation

## 2024-07-07 DIAGNOSIS — Z7901 Long term (current) use of anticoagulants: Secondary | ICD-10-CM | POA: Diagnosis not present

## 2024-07-07 DIAGNOSIS — S0083XA Contusion of other part of head, initial encounter: Secondary | ICD-10-CM | POA: Diagnosis not present

## 2024-07-07 DIAGNOSIS — S51012A Laceration without foreign body of left elbow, initial encounter: Secondary | ICD-10-CM | POA: Diagnosis not present

## 2024-07-07 DIAGNOSIS — W07XXXA Fall from chair, initial encounter: Secondary | ICD-10-CM | POA: Insufficient documentation

## 2024-07-07 DIAGNOSIS — Z23 Encounter for immunization: Secondary | ICD-10-CM | POA: Diagnosis not present

## 2024-07-07 DIAGNOSIS — T148XXA Other injury of unspecified body region, initial encounter: Secondary | ICD-10-CM

## 2024-07-07 DIAGNOSIS — W19XXXA Unspecified fall, initial encounter: Secondary | ICD-10-CM

## 2024-07-07 MED ORDER — TETANUS-DIPHTH-ACELL PERTUSSIS 5-2-15.5 LF-MCG/0.5 IM SUSP
0.5000 mL | Freq: Once | INTRAMUSCULAR | Status: AC
  Administered 2024-07-07: 0.5 mL via INTRAMUSCULAR
  Filled 2024-07-07: qty 0.5

## 2024-07-07 NOTE — Discharge Instructions (Signed)
 Today you were seen for a fall.  Your workup while in the emergency department was reassuring.  You may alternate Tylenol  for pain.  Thank you for letting us  treat you today. After reviewing your imaging, I feel you are safe to go home. Please follow up with your PCP in the next several days and provide them with your records from this visit. Return to the Emergency Room if pain becomes severe or symptoms worsen.

## 2024-07-07 NOTE — ED Triage Notes (Signed)
 Pt arrived from 5th floor where he was visiting with his wife. Pt fell asleep in chair and woke up in floor. Pt positive for head injury. Positive for plavix . A and O  x 4. Hematoma with abrasion to left forehead

## 2024-07-07 NOTE — ED Provider Notes (Signed)
 " [facility] Provider Note   CSN: [id number] Arrival date & time: 07/07/24  0443     Patient presents with: FOT   Jason [name] is a 89 y.o. male.  Presents today as a fall on thinners from the fifth floor where he was visiting his wife.  Patient fell asleep in the chair and woke up on the floor.  Patient denies any pain at this time.  Patient has abrasion and hematoma to left forehead, skin tear to left elbow, and skin tear to left wrist.  Patient is anticoagulated on Plavix .  Patient is A and O x 4.   HPI     Prior to Admission medications  Medication Sig Start Date End Date Taking? Authorizing Provider  clopidogrel  (PLAVIX ) 75 MG tablet Take 1 tablet (75 mg total) by mouth daily. 07/12/12   Gherghe, Costin M, MD  doxazosin (CARDURA) 4 MG tablet Take 4 mg by mouth daily. 01/12/20   [provider]  simvastatin  (ZOCOR ) 20 MG tablet Take 20 mg by mouth daily.    [provider]  zolpidem  (AMBIEN ) 5 MG tablet Take 10 mg by mouth at bedtime as needed. Sleep     [provider]    Allergies: Patient has no known allergies.    Review of Systems  Skin:  Positive for wound.    Updated Vital Signs BP (!) 142/85   Pulse 60   Temp 98 F (36.7 C) (Oral)   Resp (!) 24   Ht 5' 11 (1.803 m)   Wt 98.5 kg   SpO2 100%   BMI 30.29 kg/m   Physical Exam Vitals and nursing note reviewed.  Constitutional:      General: He is not in acute distress.    Appearance: He is well-developed. He is not toxic-appearing.  HENT:     Head: Normocephalic.     Comments: Approximately 2 cm in diameter hemostatic abrasion to patient's left forehead with subsequent hematoma below. Eyes:     Extraocular Movements: Extraocular movements intact.     Conjunctiva/sclera: Conjunctivae normal.     Pupils: Pupils are equal, round, and reactive to light.  Cardiovascular:     Rate and Rhythm: Normal rate and regular rhythm.     Heart  sounds: No murmur heard. Pulmonary:     Effort: Pulmonary effort is normal. No respiratory distress.     Breath sounds: Normal breath sounds.  Abdominal:     General: There is no distension.     Palpations: Abdomen is soft.     Tenderness: There is no abdominal tenderness.  Musculoskeletal:        General: No swelling.     Cervical back: Neck supple.  Skin:    General: Skin is warm and dry.     Capillary Refill: Capillary refill takes less than 2 seconds.     Comments: Skin tear to patient's ulnar aspect of left wrist and left elbow.  Patient is neurovascularly intact.  Neurological:     General: No focal deficit present.     Mental Status: He is alert and oriented to person, place, and time.  Psychiatric:        Mood and Affect: Mood normal.     (all labs ordered are listed, but only abnormal results are displayed) Labs Reviewed - No data to display  EKG: None  Radiology: CT Cervical Spine Wo Contrast Result Date: 07/07/2024 EXAM: CT CERVICAL SPINE WITHOUT CONTRAST 07/07/2024 05:14:00  AM TECHNIQUE: CT of the cervical spine was performed without the administration of intravenous contrast. Multiplanar reformatted images are provided for review. Automated exposure control, iterative reconstruction, and/or weight based adjustment of the mA/kV was utilized to reduce the radiation dose to as low as reasonably achievable. COMPARISON: None available. CLINICAL HISTORY: Neck trauma (Age >= 65y) FINDINGS: BONES AND ALIGNMENT: No acute fracture or traumatic malalignment. Slight degenerative anterolisthesis at C6-C7. DEGENERATIVE CHANGES: Mild diffuse degenerative disc disease throughout the cervical spine. Moderate right-sided neural foraminal stenosis at C3-C4 secondary to uncovertebral joint hypertrophy. SOFT TISSUES: No prevertebral soft tissue swelling. The paraspinous soft tissues are unremarkable. IMPRESSION: 1. No evidence of acute traumatic injury. 2. Mild diffuse degenerative disc  disease throughout the cervical spine with slight degenerative anterolisthesis at C6-7 and moderate right-sided neural foraminal stenosis at C3-4 secondary to uncovertebral joint hypertrophy. Electronically signed by: Evalene Coho MD 07/07/2024 05:40 AM EST RP Workstation: [id number]   DG Wrist Complete Left Result Date: 07/07/2024 EXAM: 3 OR MORE VIEW(S) XRAY OF THE LEFT WRIST 07/07/2024 05:15:55 AM COMPARISON: None available. CLINICAL HISTORY: skin tear skin tear FINDINGS: BONES AND JOINTS: There is abnormal widening of the scapholunate interval. There is also calcification within the scapholunate interval and triangular fibrocartilage. There is no apparent acute abnormality. Differential diagnoses include scapholunate ligament injury (acute or chronic), scapholunate advanced collapse (SLAC) wrist, degenerative changes, and crystal deposition disease (e.g., pseudogout, gout). Follow-up guidelines include clinical correlation, further imaging such as MRI for soft tissue evaluation (ligaments) or CT for detailed bone and calcification assessment, and orthopedic consultation. SOFT TISSUES: Unremarkable. IMPRESSION: 1. 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 Electronically signed by: Evalene Coho MD 07/07/2024 05:24 AM EST RP Workstation: [id number]   DG Elbow 2 Views Left Result Date: 07/07/2024 EXAM: 3 VIEW(S) XRAY OF THE LEFT ELBOW COMPARISON: None available. CLINICAL HISTORY: 809823 Fall 809823 Skin tear skin tear 190176 Fall 190176 FINDINGS: BONES AND JOINTS: No acute fracture. No malalignment. SOFT TISSUES: Unremarkable. IMPRESSION: 1. No significant abnormality. Electronically signed by: Evalene Coho MD 07/07/2024 05:21 AM EST RP Workstation: [id number]    CT Head Wo Contrast Result Date: 07/07/2024 EXAM: CT HEAD WITHOUT CONTRAST 07/07/2024 05:14:00 AM TECHNIQUE: CT of the head was performed without the administration of intravenous contrast. Automated exposure control, iterative reconstruction, and/or weight based adjustment of the mA/kV was utilized to reduce the radiation dose to as low as reasonably achievable. COMPARISON: CT of the head dated 03/07/2010. CLINICAL HISTORY: Head trauma, minor (Age >= 65y). FINDINGS: BRAIN AND VENTRICLES: No acute hemorrhage. No evidence of acute infarct. Age-related atrophy. Remote lacunar infarct in left corona radiata. Moderate periventricular and deep chronic small vessel ischemia. No hydrocephalus. No extra-axial collection. No mass effect or midline shift. ORBITS: No acute abnormality. Bilateral cataract resection noted. SINUSES: Mucosal thickening throughout ethmoid air cells. 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 SOFT TISSUES AND SKULL: No acute soft tissue abnormality. No skull fracture. Atherosclerosis of skullbase vasculature without hyperdense vessel or abnormal calcification. IMPRESSION: 1. No acute intracranial abnormality related to the minor head trauma. 2. Age-related atrophy, remote lacunar infarct in the left corona radiata, and moderate chronic small vessel ischemic changes. 3. Ethmoid sinus mucosal thickening and right frontal sinus osteoma. Electronically signed by: Evalene Coho MD 07/07/2024 05:21 AM EST RP Workstation: [id number]     Procedures   Medications Ordered in the ED  Tdap (ADACEL ) injection  0.5 mL (has no administration in time range)                                    Medical Decision Making Amount and/or Complexity of Data Reviewed Radiology: ordered.   This patient presents to the ED for concern of fall on thinners differential diagnosis includes brain bleed, vertebral fracture, skin tears, musculoskeletal pain, concussion  Imaging Studies ordered:  I ordered imaging  studies including CT head and C-spine Noncon I independently visualized and interpreted imaging which showed no acute intracranial abnormality related to the minor head trauma.  No evidence of acute traumatic injury. 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  Patient's symptoms likely due to musculoskeletal pain.  Patient advised to ice and take Tylenol  as needed for pain and swelling.  Patient given return precautions.  I feel patient is safe for discharge at this time.      Final diagnoses:  Fall, initial encounter  Abrasion  Skin tear of left elbow without complication, initial encounter  Tear of skin of left wrist, initial encounter    ED Discharge Orders     None          Francis Ileana SAILOR, PA-C 07/07/24 0543    Jerral Meth, MD 07/07/24 [phone]  "

## 2024-07-07 NOTE — Progress Notes (Addendum)
" °   07/07/24 0517  Spiritual Encounters  Type of Visit Initial  Care provided to: Patient  Conversation partners present during encounter Other (comment)  Referral source Trauma page  Reason for visit Trauma  OnCall Visit Yes   Responded to FOT brought from 5th floor to ED after falling while visiting inpatient. Patient resting peacefully. No family present. Chaplain not needed at this time.  "
